# Patient Record
Sex: Female | Born: 1980
Health system: Southern US, Community
[De-identification: ages and names within clinical notes are randomized; demographics above are authoritative.]

## PROBLEM LIST (undated history)

## (undated) DIAGNOSIS — K589 Irritable bowel syndrome without diarrhea: Secondary | ICD-10-CM

## (undated) DIAGNOSIS — F419 Anxiety disorder, unspecified: Secondary | ICD-10-CM

## (undated) DIAGNOSIS — K208 Other esophagitis: Principal | ICD-10-CM

## (undated) DIAGNOSIS — Z8619 Personal history of other infectious and parasitic diseases: Secondary | ICD-10-CM

## (undated) DIAGNOSIS — K219 Gastro-esophageal reflux disease without esophagitis: Secondary | ICD-10-CM

## (undated) DIAGNOSIS — K649 Unspecified hemorrhoids: Secondary | ICD-10-CM

## (undated) DIAGNOSIS — J069 Acute upper respiratory infection, unspecified: Secondary | ICD-10-CM

## (undated) DIAGNOSIS — D649 Anemia, unspecified: Secondary | ICD-10-CM

## (undated) DIAGNOSIS — F329 Major depressive disorder, single episode, unspecified: Secondary | ICD-10-CM

## (undated) DIAGNOSIS — J189 Pneumonia, unspecified organism: Secondary | ICD-10-CM

## (undated) DIAGNOSIS — F32A Depression, unspecified: Secondary | ICD-10-CM

## (undated) HISTORY — DX: Other esophagitis: K20.8

## (undated) HISTORY — PX: OTHER SURGICAL HISTORY: SHX169

## (undated) HISTORY — PX: HERNIA REPAIR: SHX51

## (undated) HISTORY — PX: TONSILLECTOMY: SUR1361

## (undated) HISTORY — PX: WISDOM TOOTH EXTRACTION: SHX21

## (undated) HISTORY — DX: Personal history of other infectious and parasitic diseases: Z86.19

## (undated) HISTORY — DX: Unspecified hemorrhoids: K64.9

## (undated) HISTORY — PX: COLONOSCOPY: SHX174

## (undated) HISTORY — DX: Anxiety disorder, unspecified: F41.9

---
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
## 2006-09-27 ENCOUNTER — Other Ambulatory Visit: Admission: RE | Admit: 2006-09-27 | Discharge: 2006-09-27 | Payer: Self-pay | Admitting: *Deleted

## 2009-06-28 ENCOUNTER — Encounter: Admission: RE | Admit: 2009-06-28 | Discharge: 2009-06-28 | Payer: Self-pay | Admitting: Family Medicine

## 2009-07-07 ENCOUNTER — Encounter: Admission: RE | Admit: 2009-07-07 | Discharge: 2009-07-07 | Payer: Self-pay | Admitting: Obstetrics & Gynecology

## 2010-04-10 ENCOUNTER — Encounter: Payer: Self-pay | Admitting: Family Medicine

## 2010-12-19 ENCOUNTER — Encounter (HOSPITAL_COMMUNITY): Payer: Self-pay | Admitting: Anesthesiology

## 2010-12-19 ENCOUNTER — Ambulatory Visit (HOSPITAL_COMMUNITY)
Admission: RE | Admit: 2010-12-19 | Discharge: 2010-12-19 | Disposition: A | Discharge status: Home / Self Care | Payer: BC Managed Care – PPO | Source: Ambulatory Visit | Attending: Obstetrics & Gynecology | Admitting: Obstetrics & Gynecology

## 2010-12-19 ENCOUNTER — Inpatient Hospital Stay (HOSPITAL_COMMUNITY): Payer: BC Managed Care – PPO

## 2010-12-19 ENCOUNTER — Other Ambulatory Visit: Payer: Self-pay | Admitting: Obstetrics & Gynecology

## 2010-12-19 ENCOUNTER — Encounter (HOSPITAL_COMMUNITY): Payer: Self-pay | Admitting: *Deleted

## 2010-12-19 ENCOUNTER — Encounter (HOSPITAL_COMMUNITY)
Admission: RE | Disposition: A | Discharge status: Home / Self Care | Payer: Self-pay | Source: Ambulatory Visit | Attending: Obstetrics & Gynecology

## 2010-12-19 ENCOUNTER — Encounter (HOSPITAL_COMMUNITY): Payer: Self-pay

## 2010-12-19 ENCOUNTER — Ambulatory Visit (HOSPITAL_COMMUNITY): Payer: BC Managed Care – PPO | Admitting: Anesthesiology

## 2010-12-19 ENCOUNTER — Inpatient Hospital Stay (HOSPITAL_COMMUNITY)
Admission: AD | Admit: 2010-12-19 | Discharge: 2010-12-19 | Disposition: A | Discharge status: Home / Self Care | Payer: BC Managed Care – PPO | Source: Ambulatory Visit | Attending: Obstetrics and Gynecology | Admitting: Obstetrics and Gynecology

## 2010-12-19 DIAGNOSIS — O4690 Antepartum hemorrhage, unspecified, unspecified trimester: Secondary | ICD-10-CM

## 2010-12-19 DIAGNOSIS — O039 Complete or unspecified spontaneous abortion without complication: Secondary | ICD-10-CM

## 2010-12-19 DIAGNOSIS — O021 Missed abortion: Secondary | ICD-10-CM | POA: Insufficient documentation

## 2010-12-19 HISTORY — DX: Major depressive disorder, single episode, unspecified: F32.9

## 2010-12-19 HISTORY — DX: Gastro-esophageal reflux disease without esophagitis: K21.9

## 2010-12-19 HISTORY — PX: DILATION AND EVACUATION: SHX1459

## 2010-12-19 HISTORY — DX: Depression, unspecified: F32.A

## 2010-12-19 HISTORY — DX: Acute upper respiratory infection, unspecified: J06.9

## 2010-12-19 HISTORY — DX: Irritable bowel syndrome, unspecified: K58.9

## 2010-12-19 LAB — CBC
Hemoglobin: 13 g/dL (ref 12.0–15.0)
MCH: 28.8 pg (ref 26.0–34.0)
MCHC: 34.3 g/dL (ref 30.0–36.0)
Platelets: 237 10*3/uL (ref 150–400)
RBC: 4.52 MIL/uL (ref 3.87–5.11)

## 2010-12-19 LAB — ABO/RH: ABO/RH(D): A POS

## 2010-12-19 SURGERY — DILATION AND EVACUATION, UTERUS
Anesthesia: Choice

## 2010-12-19 MED ORDER — PROPOFOL 10 MG/ML IV EMUL
INTRAVENOUS | Status: AC
  Filled 2010-12-19: qty 20

## 2010-12-19 MED ORDER — METHYLERGONOVINE MALEATE 0.2 MG/ML IJ SOLN
INTRAMUSCULAR | Status: AC
  Filled 2010-12-19: qty 1

## 2010-12-19 MED ORDER — ONDANSETRON HCL 4 MG/2ML IJ SOLN
INTRAMUSCULAR | Status: DC | PRN
  Administered 2010-12-19: 4 mg via INTRAVENOUS

## 2010-12-19 MED ORDER — METOCLOPRAMIDE HCL 5 MG/ML IJ SOLN: 10.0000 mg | Freq: Once | INTRAMUSCULAR | Status: DC | PRN

## 2010-12-19 MED ORDER — KETOROLAC TROMETHAMINE 30 MG/ML IJ SOLN
INTRAMUSCULAR | Status: DC | PRN
  Administered 2010-12-19: 30 mg via INTRAVENOUS

## 2010-12-19 MED ORDER — PROPOFOL 10 MG/ML IV EMUL
INTRAVENOUS | Status: DC | PRN
  Administered 2010-12-19: 50 mg via INTRAVENOUS
  Administered 2010-12-19 (×2): 20 mg via INTRAVENOUS
  Administered 2010-12-19: 40 mg via INTRAVENOUS
  Administered 2010-12-19 (×2): 10 mg via INTRAVENOUS
  Administered 2010-12-19 (×2): 20 mg via INTRAVENOUS
  Administered 2010-12-19: 50 mg via INTRAVENOUS
  Administered 2010-12-19: 10 mg via INTRAVENOUS

## 2010-12-19 MED ORDER — FENTANYL CITRATE 0.05 MG/ML IJ SOLN
INTRAMUSCULAR | Status: AC
  Filled 2010-12-19: qty 5

## 2010-12-19 MED ORDER — LIDOCAINE HCL (CARDIAC) 20 MG/ML IV SOLN
INTRAVENOUS | Status: AC
  Filled 2010-12-19: qty 5

## 2010-12-19 MED ORDER — KETOROLAC TROMETHAMINE 60 MG/2ML IM SOLN
INTRAMUSCULAR | Status: DC | PRN
  Administered 2010-12-19: 30 mg via INTRAMUSCULAR

## 2010-12-19 MED ORDER — FENTANYL CITRATE 0.05 MG/ML IJ SOLN: 25.0000 ug | INTRAMUSCULAR | Status: DC | PRN

## 2010-12-19 MED ORDER — MIDAZOLAM HCL 5 MG/5ML IJ SOLN
INTRAMUSCULAR | Status: DC | PRN
  Administered 2010-12-19: 2 mg via INTRAVENOUS

## 2010-12-19 MED ORDER — LACTATED RINGERS IV SOLN
INTRAVENOUS | Status: DC
  Administered 2010-12-19 (×3): via INTRAVENOUS

## 2010-12-19 MED ORDER — DEXAMETHASONE SODIUM PHOSPHATE 10 MG/ML IJ SOLN
INTRAMUSCULAR | Status: DC | PRN
  Administered 2010-12-19: 5 mg via INTRAVENOUS

## 2010-12-19 MED ORDER — CEFAZOLIN SODIUM 1-5 GM-% IV SOLN
1.0000 g | INTRAVENOUS | Status: AC
  Administered 2010-12-19: 1 g via INTRAVENOUS

## 2010-12-19 MED ORDER — MIDAZOLAM HCL 2 MG/2ML IJ SOLN
INTRAMUSCULAR | Status: AC
Start: 2010-12-19 — End: 2010-12-19
  Filled 2010-12-19: qty 2

## 2010-12-19 MED ORDER — OXYCODONE-ACETAMINOPHEN 5-325 MG PO TABS: 1.0000 | ORAL_TABLET | Freq: Four times a day (QID) | ORAL | Status: AC | PRN

## 2010-12-19 MED ORDER — ONDANSETRON HCL 4 MG/2ML IJ SOLN
INTRAMUSCULAR | Status: AC
  Filled 2010-12-19: qty 2

## 2010-12-19 MED ORDER — METHYLERGONOVINE MALEATE 0.2 MG/ML IJ SOLN
INTRAMUSCULAR | Status: DC | PRN
  Administered 2010-12-19: 0.2 mg via INTRAMUSCULAR

## 2010-12-19 MED ORDER — CHLOROPROCAINE HCL 1 % IJ SOLN
INTRAMUSCULAR | Status: DC | PRN
  Administered 2010-12-19: 30 mL

## 2010-12-19 MED ORDER — LIDOCAINE HCL (CARDIAC) 20 MG/ML IV SOLN
INTRAVENOUS | Status: DC | PRN
  Administered 2010-12-19: 60 mg via INTRAVENOUS

## 2010-12-19 MED ORDER — FENTANYL CITRATE 0.05 MG/ML IJ SOLN
INTRAMUSCULAR | Status: DC | PRN
  Administered 2010-12-19 (×3): 50 ug via INTRAVENOUS

## 2010-12-19 MED ORDER — DOXYCYCLINE HYCLATE 100 MG PO TABS: 100.0000 mg | ORAL_TABLET | Freq: Two times a day (BID) | ORAL | Status: AC

## 2010-12-19 SURGICAL SUPPLY — 20 items
CATH ROBINSON RED A/P 16FR (CATHETERS) ×2 IMPLANT
CLOTH BEACON ORANGE TIMEOUT ST (SAFETY) ×2 IMPLANT
DECANTER SPIKE VIAL GLASS SM (MISCELLANEOUS) ×2 IMPLANT
DRAPE UTILITY XL STRL (DRAPES) ×2 IMPLANT
GLOVE BIO SURGEON STRL SZ7 (GLOVE) ×2 IMPLANT
GLOVE BIOGEL PI IND STRL 7.0 (GLOVE) ×2 IMPLANT
GLOVE BIOGEL PI INDICATOR 7.0 (GLOVE) ×2
GOWN PREVENTION PLUS LG XLONG (DISPOSABLE) ×2 IMPLANT
KIT BERKELEY 1ST TRIMESTER 3/8 (MISCELLANEOUS) ×2 IMPLANT
NEEDLE SPNL 22GX3.5 QUINCKE BK (NEEDLE) ×2 IMPLANT
NS IRRIG 1000ML POUR BTL (IV SOLUTION) ×2 IMPLANT
PACK VAGINAL MINOR WOMEN LF (CUSTOM PROCEDURE TRAY) ×2 IMPLANT
PAD PREP 24X48 CUFFED NSTRL (MISCELLANEOUS) ×2 IMPLANT
SET BERKELEY SUCTION TUBING (SUCTIONS) ×2 IMPLANT
SYR CONTROL 10ML LL (SYRINGE) ×2 IMPLANT
TOWEL OR 17X24 6PK STRL BLUE (TOWEL DISPOSABLE) ×4 IMPLANT
VACURETTE 10 RIGID CVD (CANNULA) IMPLANT
VACURETTE 7MM CVD STRL WRAP (CANNULA) IMPLANT
VACURETTE 8 RIGID CVD (CANNULA) IMPLANT
VACURETTE 9 RIGID CVD (CANNULA) IMPLANT

## 2010-12-19 NOTE — H&P (Signed)
Stephanie Harrington is an 30 y.o. female G1 at 6.5/7 wks by [facility] with missed abortion, no fetal cardiac activity, some bleeding, seen in ED on 12/18/10 night that confirmed MAB.  Quit smoking 1 yr back.  Patient's last menstrual period was 10/27/2010. Menses regular before pregnancy.    Past Medical History  Diagnosis Date  . IBS (irritable bowel syndrome)   . Depression   . Recurrent upper respiratory infection (URI)   . GERD (gastroesophageal reflux disease)     no meds    Past Surgical History  Procedure Date  . Tonsillectomy   . Addenoidectomy   . Wisdom tooth extraction     Family History  Problem Relation Age of Onset  . Heart disease Mother   . Hyperlipidemia Father   . Hypertension Father   . Cancer Maternal Grandmother   . Heart disease Maternal Grandmother   . Coronary artery disease Maternal Grandmother     Social History:  reports that she quit smoking about a year ago. Her smoking use included Cigarettes. She has a 2.25 pack-year smoking history. She does not have any smokeless tobacco history on file. She reports that she does not drink alcohol or use illicit drugs.  Allergies: No Known Allergies  Prescriptions prior to admission  Medication Sig Dispense Refill  . acetaminophen (TYLENOL) 500 MG tablet Take 500 mg by mouth every 6 (six) hours as needed. For pain       . dimenhyDRINATE (DRAMAMINE) 50 MG tablet Take 50 mg by mouth every 8 (eight) hours as needed.        . prenatal vitamin w/FE, FA (PRENATAL 1 + 1) 27-1 MG TABS Take 1 tablet by mouth daily.          @ROS @ Denies SOB/chest pain/ HA/ vision changes/ LE pain or swelling/ etc.  Objective:   Blood pressure 119/74, pulse 61, temperature 98.8 F (37.1 C), temperature source Oral, resp. rate 18, weight 131 lb (59.421 kg), last menstrual period 10/27/2010, SpO2 100.00%. Physical exam:  A&O x 3, no acute distress. Pleasant HEENT neg, no thyromegaly Lungs CTA bilat CV RRR, S1S2 normal Abdo soft, non  tender, non acute Extr no edema/ tenderness Pelvic deferred.    Results for orders placed during the hospital encounter of 12/19/10 (from the past 24 hour(s))  CBC     Status: Normal   Collection Time   12/19/10  3:52 PM      Component Value Range   WBC 9.5  4.0 - 10.5 (K/uL)   RBC 4.52  3.87 - 5.11 (MIL/uL)   Hemoglobin 13.0  12.0 - 15.0 (g/dL)   HCT 86.5  78.4 - 69.6 (%)   MCV 83.8  78.0 - 100.0 (fL)   MCH 28.8  26.0 - 34.0 (pg)   MCHC 34.3  30.0 - 36.0 (g/dL)   RDW 29.5  28.4 - 13.2 (%)   Platelets 237  150 - 400 (K/uL)   Assessment/Plan: G1 at 6.5/7 wks, Missed abortion. Management options reviewed, NPO since 9 pm last night. Desires D&E in OR under anesthesia. Procedure reviewed, consent obtained.   Shawne Bulow R 12/19/2010, 5:08 PM

## 2010-12-19 NOTE — Op Note (Signed)
Preoperative diagnosis: 6.5/[redacted] wk gestation with missed abortion Postop diagnosis: as above.  Procedure: Suction D&E Anesthesia: MAC and paracervical block.. Surgeon: Shea Evans, MD Assistant: none  IV fluids : LR 800 cc in OR (1lit prior to in holding area) Estimated blood loss : 30 cc Urine output:  No cath. Patient voided pre-op Complications none Condition stable Disposition PACU and then home Specimen: products of conception sent to pathology.  Procedure  Indication: G1P0 at 6.5/7 wks with bleeding and pelvic sono consistent with missed abortion. Options were reviewed in office with risks/ benefits. Patient chose to proceed with suction D&E. Risks/ complications of surgery including infection, bleeding, damage to internal organs including uterine perforation, Asherman syndrome were reviewed. She voiced understanding and gave informed written consent.  Patient was brought to the operating room with IV running. Time out was carried out. She received preop 1 gm Ancef. She underwent general endotracheal anesthesia without complications. She was given dorsolithotomy position. Parts were prepped and draped in standard fashion.  Bimanual exam revealed uterus to be retroverted and 8-10 week size.  Speculum was placed and cervix was grasped with single-tooth tenaculum. The uterus was sounded to 9 cm. 30 cc of 1% plain nasocaine used for anterior cervical and paracervical block. Cervical os was dilated to 23 [place] dilator. A 7 curved plastic suction curette was introduced in the uterine cavity and suction evacuation of products of conception was done in multiple stepwise fashion. Gentle sharp curettage was performed. Suction cannula was reintroduced and uterus was emptied.  Prophylactic Methergine 0.2 mg IM given since uterine cavity sounded larger than 6-7 wks.The bleeding was controlled well and uterus was firm on exam.  At this point the procedure was complete. Pinpoint bleeding noted from  tenaculum site and Monsel solution applied. Toradol received in OR.  All counts are correct x2. Specimen products of conception. No complications. Patient was made supine, recovered from anesthesia and brought to the recovery room in stable condition.  Patient will be discharged home. Follow up in 2 weeks in office. Post op care, warning signs of infection and excessive bleeding reviewed.   V.Keldric Poyer, MD.

## 2010-12-19 NOTE — Progress Notes (Signed)
Dr. Cherly Hensen notified of [place] results.  Will be up to see pt.

## 2010-12-19 NOTE — Progress Notes (Signed)
Dr. Cherly Hensen called.  Order given for ABO/RH.  See orders.

## 2010-12-19 NOTE — Anesthesia Preprocedure Evaluation (Addendum)
Anesthesia Evaluation  Name, MR# and DOB Patient awake  General Assessment Comment  Reviewed: Allergy & Precautions, H&P , NPO status , Patient's Chart, lab work & pertinent test results  Airway Mallampati: II TM Distance: >3 FB Neck ROM: full    Dental No notable dental hx. (+) Teeth Intact   Pulmonary    Pulmonary exam normal       Cardiovascular     Neuro/Psych Negative Psych ROS   GI/Hepatic negative GI ROS Neg liver ROS    Endo/Other    Renal/GU      Musculoskeletal   Abdominal   Peds  Hematology negative hematology ROS (+)   Anesthesia Other Findings   Reproductive/Obstetrics negative OB ROS                           Anesthesia Physical Anesthesia Plan  ASA: II  Anesthesia Plan: MAC   Post-op Pain Management:    Induction:   Airway Management Planned:   Additional Equipment:   Intra-op Plan:   Post-operative Plan:   Informed Consent: I have reviewed the patients History and Physical, chart, labs and discussed the procedure including the risks, benefits and alternatives for the proposed anesthesia with the patient or authorized representative who has indicated his/her understanding and acceptance.   Dental Advisory Given  Plan Discussed with: Anesthesiologist  Anesthesia Plan Comments:        Anesthesia Quick Evaluation

## 2010-12-19 NOTE — Transfer of Care (Signed)
Immediate Anesthesia Transfer of Care Note  Patient: Stephanie Harrington  Procedure(s) Performed:  DILATATION AND EVACUATION (D&E)  Patient Location: PACU  Anesthesia Type: MAC  Level of Consciousness: awake, alert  and oriented  Airway & Oxygen Therapy: Patient Spontanous Breathing  Post-op Assessment: Report given to PACU RN  Post vital signs: Reviewed and stable  Complications: No apparent anesthesia complications

## 2010-12-19 NOTE — Progress Notes (Signed)
Pt states she had sex yesterday and tonight she discovered bleeding on tissue after wiping.  Scant amt red/brown blood on tissue her in MAU

## 2010-12-19 NOTE — Progress Notes (Signed)
SSE done per Dr. Cherly Hensen.  VE done.

## 2010-12-19 NOTE — Progress Notes (Signed)
Waiting for [place] results to call physician.

## 2010-12-19 NOTE — Progress Notes (Signed)
Dr. Cherly Hensen at bedside.  Assessment done and poc discussed with pt.

## 2010-12-19 NOTE — Anesthesia Postprocedure Evaluation (Signed)
  Anesthesia Post-op Note  Patient: Stephanie Harrington  Procedure(s) Performed:  DILATATION AND EVACUATION (D&E)  Patient Location: PACU  Anesthesia Type: MAC  Level of Consciousness: awake, alert  and oriented  Airway and Oxygen Therapy: Patient Spontanous Breathing  Post-op Pain: none  Post-op Assessment: Post-op Vital signs reviewed, Patient's Cardiovascular Status Stable, Respiratory Function Stable, Patent Airway, No signs of Nausea or vomiting and Pain level controlled  Post-op Vital Signs: Reviewed and stable  Complications: No apparent anesthesia complications

## 2010-12-19 NOTE — ED Provider Notes (Signed)
History     Chief Complaint  Patient presents with  . Vaginal Bleeding   HPI 30 yo G1P0 MWF now @ 8 wk by LMP presents for evaluation of vaginal bleeding starting at 1:30 am. (-)cramping. Last coitus 5pm sunday  OB History    Grav Para Term Preterm Abortions TAB SAB Ect Mult Living   1               Past Medical History  Diagnosis Date  . IBS (irritable bowel syndrome)     Past Surgical History  Procedure Date  . Tonsillectomy   . Addenoidectomy   . Wisdom tooth extraction     Family History  Problem Relation Age of Onset  . Heart disease Mother   . Hyperlipidemia Father   . Hypertension Father   . Cancer Maternal Grandmother   . Heart disease Maternal Grandmother     History  Substance Use Topics  . Smoking status: Former Smoker -- 0.2 packs/day for 9 years    Types: Cigarettes    Quit date: 12/18/2009  . Smokeless tobacco: Not on file  . Alcohol Use: No    Allergies: No Known Allergies  Prescriptions prior to admission  Medication Sig Dispense Refill  . acetaminophen (TYLENOL) 500 MG tablet Take 500 mg by mouth every 6 (six) hours as needed. For pain       . prenatal vitamin w/FE, FA (PRENATAL 1 + 1) 27-1 MG TABS Take 1 tablet by mouth daily.          Review of Systems  Genitourinary:       Vaginal bleeding  All other systems reviewed and are negative.   Physical Exam   Blood pressure 129/92, pulse 80, temperature 99 F (37.2 C), temperature source Oral, resp. rate 16, height 5\' 4"  (1.626 m), weight 133 lb (60.328 kg).  Physical Exam  Constitutional: She appears well-developed and well-nourished.  HENT:  Head: Normocephalic.  Cardiovascular: Regular rhythm.   Respiratory: Breath sounds normal.  GI: Soft. There is no tenderness.  Genitourinary: Vaginal discharge found.       Dark brown blood. Vulva (-)lesion. Cervix closed. Uterus sl enlarged 7 wk size nontender  No adnexal mass  Neurological: She is alert.  Psychiatric: She has a normal mood  and affect.    MAU Course  Procedures ultrasound: abnormal sac w/ debris 6wk 5 day no fetal pole  MDM   Assessment and Plan  Missed AB w/ 1st trim vaginal bleeding P) Rh status. Rhophylac if indicated. Findings of sono reviewed. Options disc( SAB, cytotec, D&E). 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 Dr. Juliene Pina prim OB so will call office to disc later today. SAB precautions  Zorana Brockwell A 12/19/2010, 4:55 AM

## 2010-12-20 ENCOUNTER — Encounter (HOSPITAL_COMMUNITY): Payer: Self-pay | Admitting: Obstetrics & Gynecology

## 2010-12-21 NOTE — Addendum Note (Signed)
Addendum  created 12/21/10 1953 by Gerard Bonus L. Ayiden Milliman   Modules edited:Charting, Inpatient Notes

## 2010-12-21 NOTE — Addendum Note (Signed)
Addendum  created 12/21/10 1953 by Perpetua Elling L. Amaree Loisel   Modules edited:Charting, Inpatient Notes    

## 2011-04-14 LAB — OB RESULTS CONSOLE HEPATITIS B SURFACE ANTIGEN: Hepatitis B Surface Ag: NEGATIVE

## 2011-04-14 LAB — OB RESULTS CONSOLE ABO/RH: RH Type: POSITIVE

## 2011-04-14 LAB — OB RESULTS CONSOLE HIV ANTIBODY (ROUTINE TESTING): HIV: NONREACTIVE

## 2011-04-14 LAB — OB RESULTS CONSOLE RUBELLA ANTIBODY, IGM: Rubella: NON-IMMUNE/NOT IMMUNE

## 2011-04-14 LAB — OB RESULTS CONSOLE VARICELLA ZOSTER ANTIBODY, IGG: Varicella: NON-IMMUNE/NOT IMMUNE

## 2011-11-16 ENCOUNTER — Other Ambulatory Visit: Payer: Self-pay | Admitting: Obstetrics & Gynecology

## 2011-11-17 ENCOUNTER — Inpatient Hospital Stay (HOSPITAL_COMMUNITY): Payer: BC Managed Care – PPO

## 2011-11-17 ENCOUNTER — Encounter (HOSPITAL_COMMUNITY): Payer: Self-pay

## 2011-11-17 ENCOUNTER — Inpatient Hospital Stay (HOSPITAL_COMMUNITY)
Admission: RE | Admit: 2011-11-17 | Discharge: 2011-11-21 | DRG: 370 | Disposition: A | Discharge status: Home / Self Care | Payer: BC Managed Care – PPO | Source: Ambulatory Visit | Attending: Obstetrics & Gynecology | Admitting: Obstetrics & Gynecology

## 2011-11-17 ENCOUNTER — Encounter (HOSPITAL_COMMUNITY): Payer: Self-pay | Admitting: *Deleted

## 2011-11-17 ENCOUNTER — Telehealth (HOSPITAL_COMMUNITY): Payer: Self-pay | Admitting: *Deleted

## 2011-11-17 DIAGNOSIS — O34219 Maternal care for unspecified type scar from previous cesarean delivery: Secondary | ICD-10-CM | POA: Diagnosis present

## 2011-11-17 DIAGNOSIS — D62 Acute posthemorrhagic anemia: Secondary | ICD-10-CM | POA: Diagnosis not present

## 2011-11-17 DIAGNOSIS — O324XX0 Maternal care for high head at term, not applicable or unspecified: Secondary | ICD-10-CM | POA: Diagnosis present

## 2011-11-17 DIAGNOSIS — O411090 Infection of amniotic sac and membranes, unspecified, unspecified trimester, not applicable or unspecified: Secondary | ICD-10-CM | POA: Diagnosis present

## 2011-11-17 DIAGNOSIS — O9903 Anemia complicating the puerperium: Secondary | ICD-10-CM | POA: Diagnosis not present

## 2011-11-17 LAB — CBC
MCH: 30.5 pg (ref 26.0–34.0)
MCHC: 35.4 g/dL (ref 30.0–36.0)
Platelets: 235 10*3/uL (ref 150–400)

## 2011-11-17 MED ORDER — OXYTOCIN BOLUS FROM INFUSION
250.0000 mL | Freq: Once | INTRAVENOUS | Status: DC
  Filled 2011-11-17: qty 500

## 2011-11-17 MED ORDER — BUTORPHANOL TARTRATE 1 MG/ML IJ SOLN
1.0000 mg | INTRAMUSCULAR | Status: DC | PRN
  Administered 2011-11-18 (×2): 1 mg via INTRAVENOUS
  Filled 2011-11-17 (×2): qty 1

## 2011-11-17 MED ORDER — FLEET ENEMA 7-19 GM/118ML RE ENEM: 1.0000 | ENEMA | RECTAL | Status: DC | PRN

## 2011-11-17 MED ORDER — LIDOCAINE HCL (PF) 1 % IJ SOLN: 30.0000 mL | INTRAMUSCULAR | Status: DC | PRN

## 2011-11-17 MED ORDER — ONDANSETRON HCL 4 MG/2ML IJ SOLN: 4.0000 mg | Freq: Four times a day (QID) | INTRAMUSCULAR | Status: DC | PRN

## 2011-11-17 MED ORDER — ACETAMINOPHEN 325 MG PO TABS: 650.0000 mg | ORAL_TABLET | ORAL | Status: DC | PRN

## 2011-11-17 MED ORDER — TERBUTALINE SULFATE 1 MG/ML IJ SOLN: 0.2500 mg | Freq: Once | INTRAMUSCULAR | Status: AC | PRN

## 2011-11-17 MED ORDER — IBUPROFEN 600 MG PO TABS: 600.0000 mg | ORAL_TABLET | Freq: Four times a day (QID) | ORAL | Status: DC | PRN

## 2011-11-17 MED ORDER — OXYCODONE-ACETAMINOPHEN 5-325 MG PO TABS: 1.0000 | ORAL_TABLET | ORAL | Status: DC | PRN

## 2011-11-17 MED ORDER — LACTATED RINGERS IV SOLN
500.0000 mL | INTRAVENOUS | Status: DC | PRN
  Administered 2011-11-18: 500 mL via INTRAVENOUS

## 2011-11-17 MED ORDER — LACTATED RINGERS IV SOLN
INTRAVENOUS | Status: DC
  Administered 2011-11-17 – 2011-11-18 (×2): via INTRAVENOUS

## 2011-11-17 MED ORDER — CITRIC ACID-SODIUM CITRATE 334-500 MG/5ML PO SOLN
30.0000 mL | ORAL | Status: DC | PRN
  Administered 2011-11-18: 30 mL via ORAL
  Filled 2011-11-17: qty 15

## 2011-11-17 MED ORDER — ZOLPIDEM TARTRATE 5 MG PO TABS
5.0000 mg | ORAL_TABLET | Freq: Every evening | ORAL | Status: DC | PRN
  Administered 2011-11-18: 5 mg via ORAL
  Filled 2011-11-17: qty 1

## 2011-11-17 MED ORDER — MISOPROSTOL 25 MCG QUARTER TABLET
25.0000 ug | ORAL_TABLET | ORAL | Status: DC | PRN
  Administered 2011-11-17: 25 ug via VAGINAL
  Filled 2011-11-17: qty 0.25

## 2011-11-17 MED ORDER — OXYTOCIN 40 UNITS IN LACTATED RINGERS INFUSION - SIMPLE MED: 62.5000 mL/h | Freq: Once | INTRAVENOUS | Status: DC

## 2011-11-17 NOTE — Progress Notes (Signed)
Dr. Juliene Pina notified of induction pt here; informed that RN was unable to determine fetal presentation by cervical exam. Order received for bedside U/S to determine presentation.

## 2011-11-17 NOTE — H&P (Signed)
Stephanie Harrington is a 31 y.o. female requesting labor IOL for dates. Denies contraxns/bleeding/leaking. Feels good FMs.  [facility] from 8 wks. No complications after 1st trim bleeding and placenta previa that resolved.  Last sono 8/29 7'12", Vtx.    History OB History    Grav Para Term Preterm Abortions TAB SAB Ect Mult Living   2 0   1  1        Past Medical History  Diagnosis Date  . IBS (irritable bowel syndrome)   . Depression   . Recurrent upper respiratory infection (URI)   . GERD (gastroesophageal reflux disease)     no meds  . Hemorrhoid   . H/O varicella    Past Surgical History  Procedure Date  . Tonsillectomy   . Addenoidectomy   . Wisdom tooth extraction   . Dilation and evacuation 12/19/2010    Procedure: DILATATION AND EVACUATION (D&E);  Surgeon: Robley Fries;  Location: [facility];  Service: Gynecology;  Laterality: N/A;   Family History: family history includes Cancer in her father and maternal grandmother; Coronary artery disease in her maternal grandmother; Heart attack in her mother; Heart disease in her maternal grandmother and mother; Hyperlipidemia in her father; and Hypertension in her father. Social History:  reports that she quit smoking about 22 months ago. Her smoking use included Cigarettes. She has a 2.25 pack-year smoking history. She has never used smokeless tobacco. She reports that she does not drink alcohol or use illicit drugs.   Prenatal Transfer Tool  Maternal Diabetes: No Genetic Screening: Normal Maternal Ultrasounds/Referrals: Normal Fetal Ultrasounds or other Referrals:  None Maternal Substance Abuse:  No Significant Maternal Medications:  None Significant Maternal Lab Results:  None Other Comments:  None  Review of Systems  Constitutional: Negative for fever.  Respiratory: Negative for cough.   Cardiovascular: Negative for chest pain.  Gastrointestinal: Negative for heartburn.  Musculoskeletal: Negative for myalgias.    Skin: Negative for rash.  Neurological: Negative for headaches.    Dilation: 1 Effacement (%): Thick Station: -3 Exam by:: C. Blackstock, RN Blood pressure 138/81, pulse 64, temperature 98.5 F (36.9 C), temperature source Oral, resp. rate 18, height 5\' 4"  (1.626 m), weight 156 lb (70.761 kg), last menstrual period 02/08/2011. Exam Physical Exam  A&O x 3, no acute distress. Pleasant HEENT neg, no thyromegaly Lungs CTA bilat CV RRR, S1S2 normal Abdo soft, non tender, non acute, relaxed gravid uterus Extr no edema/ tenderness Pelvic 1/50%/-3/ ballotable Vtx FHT  120s/ + accels/ no decels/mod variability- reassuring- category I Toco occasional irregular.   Prenatal labs: ABO, Rh: A/Positive/-- (01/25 0000) Antibody: Negative (01/25 0000) Rubella: Nonimmune (01/25 0000) RPR: Nonreactive (01/25 0000)  HBsAg: Negative (01/25 0000)  HIV: Non-reactive (01/25 0000)  GBS: Negative (07/30 0000)  Glucola nl Anatomy sono nl, and nl interval growth  Assessment/Plan: G2P0010 at 40.2/7 wks. Here for labor IOL. EFW 7.1/2 lbs  GBS neg. Unengaged vertex, confirmed by sono 8/28 and today (bedside) Cytotec IOL and then pitocin. NPO after midnight, assess fetal descent.     Wanda Rideout R 11/17/2011, 10:31 PM

## 2011-11-17 NOTE — Telephone Encounter (Signed)
Preadmission screen  

## 2011-11-18 ENCOUNTER — Encounter (HOSPITAL_COMMUNITY): Payer: Self-pay | Admitting: Anesthesiology

## 2011-11-18 ENCOUNTER — Inpatient Hospital Stay (HOSPITAL_COMMUNITY): Payer: BC Managed Care – PPO | Admitting: Anesthesiology

## 2011-11-18 ENCOUNTER — Encounter (HOSPITAL_COMMUNITY)
Admission: RE | Disposition: A | Discharge status: Home / Self Care | Payer: Self-pay | Source: Ambulatory Visit | Attending: Obstetrics & Gynecology

## 2011-11-18 LAB — RPR: RPR Ser Ql: NONREACTIVE

## 2011-11-18 SURGERY — Surgical Case
Anesthesia: Regional | Site: Abdomen | Wound class: Clean Contaminated

## 2011-11-18 MED ORDER — FENTANYL 2.5 MCG/ML BUPIVACAINE 1/10 % EPIDURAL INFUSION (WH - ANES)
14.0000 mL/h | INTRAMUSCULAR | Status: DC
  Administered 2011-11-18 (×3): 14 mL/h via EPIDURAL
  Filled 2011-11-18 (×4): qty 60

## 2011-11-18 MED ORDER — PHENYLEPHRINE HCL 10 MG/ML IJ SOLN
INTRAMUSCULAR | Status: DC | PRN
  Administered 2011-11-18 (×2): 80 ug via INTRAVENOUS
  Administered 2011-11-18 (×2): 120 ug via INTRAVENOUS

## 2011-11-18 MED ORDER — SCOPOLAMINE 1 MG/3DAYS TD PT72
MEDICATED_PATCH | TRANSDERMAL | Status: AC
  Filled 2011-11-18: qty 1

## 2011-11-18 MED ORDER — TERBUTALINE SULFATE 1 MG/ML IJ SOLN: 0.2500 mg | Freq: Once | INTRAMUSCULAR | Status: DC | PRN

## 2011-11-18 MED ORDER — OXYTOCIN 10 UNIT/ML IJ SOLN
40.0000 [IU] | INTRAVENOUS | Status: DC | PRN
  Administered 2011-11-18: 40 [IU] via INTRAVENOUS

## 2011-11-18 MED ORDER — SODIUM BICARBONATE 8.4 % IV SOLN
INTRAVENOUS | Status: AC
  Filled 2011-11-18: qty 50

## 2011-11-18 MED ORDER — FENTANYL CITRATE 0.05 MG/ML IJ SOLN
INTRAMUSCULAR | Status: AC
  Filled 2011-11-18: qty 2

## 2011-11-18 MED ORDER — CEFAZOLIN SODIUM-DEXTROSE 2-3 GM-% IV SOLR
2.0000 g | Freq: Three times a day (TID) | INTRAVENOUS | Status: AC
  Administered 2011-11-19 (×2): 2 g via INTRAVENOUS
  Filled 2011-11-18 (×2): qty 50

## 2011-11-18 MED ORDER — HYDROMORPHONE HCL PF 1 MG/ML IJ SOLN
0.2500 mg | INTRAMUSCULAR | Status: DC | PRN
  Administered 2011-11-18: 0.5 mg via INTRAVENOUS

## 2011-11-18 MED ORDER — DIPHENHYDRAMINE HCL 50 MG/ML IJ SOLN: 12.5000 mg | INTRAMUSCULAR | Status: DC | PRN

## 2011-11-18 MED ORDER — EPHEDRINE 5 MG/ML INJ: 10.0000 mg | INTRAVENOUS | Status: DC | PRN

## 2011-11-18 MED ORDER — LIDOCAINE-EPINEPHRINE (PF) 2 %-1:200000 IJ SOLN
INTRAMUSCULAR | Status: AC
  Filled 2011-11-18: qty 20

## 2011-11-18 MED ORDER — LACTATED RINGERS IV SOLN: 500.0000 mL | Freq: Once | INTRAVENOUS | Status: DC

## 2011-11-18 MED ORDER — MEPERIDINE HCL 25 MG/ML IJ SOLN
INTRAMUSCULAR | Status: DC | PRN
  Administered 2011-11-18 (×2): 12.5 mg via INTRAVENOUS

## 2011-11-18 MED ORDER — KETOROLAC TROMETHAMINE 60 MG/2ML IM SOLN
INTRAMUSCULAR | Status: AC
  Filled 2011-11-18: qty 2

## 2011-11-18 MED ORDER — KETOROLAC TROMETHAMINE 60 MG/2ML IM SOLN
60.0000 mg | Freq: Once | INTRAMUSCULAR | Status: AC | PRN
  Administered 2011-11-18: 60 mg via INTRAMUSCULAR

## 2011-11-18 MED ORDER — HYDROMORPHONE HCL PF 1 MG/ML IJ SOLN
INTRAMUSCULAR | Status: AC
  Filled 2011-11-18: qty 1

## 2011-11-18 MED ORDER — OXYTOCIN 10 UNIT/ML IJ SOLN
INTRAMUSCULAR | Status: AC
  Filled 2011-11-18: qty 4

## 2011-11-18 MED ORDER — EPHEDRINE 5 MG/ML INJ
10.0000 mg | INTRAVENOUS | Status: DC | PRN
  Filled 2011-11-18: qty 4

## 2011-11-18 MED ORDER — PHENYLEPHRINE 40 MCG/ML (10ML) SYRINGE FOR IV PUSH (FOR BLOOD PRESSURE SUPPORT)
PREFILLED_SYRINGE | INTRAVENOUS | Status: AC
  Filled 2011-11-18: qty 10

## 2011-11-18 MED ORDER — PHENYLEPHRINE 40 MCG/ML (10ML) SYRINGE FOR IV PUSH (FOR BLOOD PRESSURE SUPPORT): 80.0000 ug | PREFILLED_SYRINGE | INTRAVENOUS | Status: DC | PRN

## 2011-11-18 MED ORDER — ONDANSETRON HCL 4 MG/2ML IJ SOLN
INTRAMUSCULAR | Status: DC | PRN
  Administered 2011-11-18: 4 mg via INTRAVENOUS

## 2011-11-18 MED ORDER — FENTANYL CITRATE 0.05 MG/ML IJ SOLN
INTRAMUSCULAR | Status: DC | PRN
  Administered 2011-11-18 (×2): 100 ug via INTRAVENOUS

## 2011-11-18 MED ORDER — MORPHINE SULFATE 0.5 MG/ML IJ SOLN
INTRAMUSCULAR | Status: AC
  Filled 2011-11-18: qty 10

## 2011-11-18 MED ORDER — MORPHINE SULFATE (PF) 0.5 MG/ML IJ SOLN
INTRAMUSCULAR | Status: DC | PRN
  Administered 2011-11-18: 1 mg via INTRAVENOUS

## 2011-11-18 MED ORDER — LACTATED RINGERS IV SOLN
INTRAVENOUS | Status: DC | PRN
  Administered 2011-11-18 (×3): via INTRAVENOUS

## 2011-11-18 MED ORDER — MEPERIDINE HCL 25 MG/ML IJ SOLN: 6.2500 mg | INTRAMUSCULAR | Status: DC | PRN

## 2011-11-18 MED ORDER — CEFAZOLIN SODIUM-DEXTROSE 2-3 GM-% IV SOLR: 2.0000 g | INTRAVENOUS | Status: DC

## 2011-11-18 MED ORDER — CEFAZOLIN SODIUM 1-5 GM-% IV SOLN
INTRAVENOUS | Status: DC | PRN
  Administered 2011-11-18: 2 g via INTRAVENOUS

## 2011-11-18 MED ORDER — MEPERIDINE HCL 25 MG/ML IJ SOLN
INTRAMUSCULAR | Status: AC
  Filled 2011-11-18: qty 1

## 2011-11-18 MED ORDER — SODIUM BICARBONATE 8.4 % IV SOLN
INTRAVENOUS | Status: DC | PRN
  Administered 2011-11-18: 4 mL via EPIDURAL

## 2011-11-18 MED ORDER — MORPHINE SULFATE (PF) 0.5 MG/ML IJ SOLN
INTRAMUSCULAR | Status: DC | PRN
  Administered 2011-11-18: 4 mg via EPIDURAL

## 2011-11-18 MED ORDER — CEFAZOLIN SODIUM-DEXTROSE 2-3 GM-% IV SOLR
INTRAVENOUS | Status: AC
  Filled 2011-11-18: qty 50

## 2011-11-18 MED ORDER — SCOPOLAMINE 1 MG/3DAYS TD PT72
1.0000 | MEDICATED_PATCH | Freq: Once | TRANSDERMAL | Status: DC
  Administered 2011-11-18: 1.5 mg via TRANSDERMAL

## 2011-11-18 MED ORDER — FENTANYL 2.5 MCG/ML BUPIVACAINE 1/10 % EPIDURAL INFUSION (WH - ANES)
INTRAMUSCULAR | Status: DC | PRN
  Administered 2011-11-18: 14 mL/h via EPIDURAL

## 2011-11-18 MED ORDER — ONDANSETRON HCL 4 MG/2ML IJ SOLN
INTRAMUSCULAR | Status: AC
  Filled 2011-11-18: qty 2

## 2011-11-18 MED ORDER — PHENYLEPHRINE 40 MCG/ML (10ML) SYRINGE FOR IV PUSH (FOR BLOOD PRESSURE SUPPORT)
80.0000 ug | PREFILLED_SYRINGE | INTRAVENOUS | Status: DC | PRN
  Filled 2011-11-18: qty 5

## 2011-11-18 MED ORDER — OXYTOCIN 40 UNITS IN LACTATED RINGERS INFUSION - SIMPLE MED
1.0000 m[IU]/min | INTRAVENOUS | Status: DC
  Administered 2011-11-18: 1 m[IU]/min via INTRAVENOUS
  Filled 2011-11-18: qty 1000

## 2011-11-18 SURGICAL SUPPLY — 39 items
APL SKNCLS STERI-STRIP NONHPOA (GAUZE/BANDAGES/DRESSINGS) ×1
BENZOIN TINCTURE PRP APPL 2/3 (GAUZE/BANDAGES/DRESSINGS) ×2 IMPLANT
CHLORAPREP W/TINT 26ML (MISCELLANEOUS) ×2 IMPLANT
CLOTH BEACON ORANGE TIMEOUT ST (SAFETY) ×2 IMPLANT
CONTAINER PREFILL 10% NBF 15ML (MISCELLANEOUS) IMPLANT
DRESSING TELFA 8X3 (GAUZE/BANDAGES/DRESSINGS) ×2 IMPLANT
ELECT REM PT RETURN 9FT ADLT (ELECTROSURGICAL) ×2
ELECTRODE REM PT RTRN 9FT ADLT (ELECTROSURGICAL) ×1 IMPLANT
EXTRACTOR VACUUM KIWI (MISCELLANEOUS) IMPLANT
EXTRACTOR VACUUM M CUP 4 TUBE (SUCTIONS) IMPLANT
GAUZE SPONGE 4X4 12PLY STRL LF (GAUZE/BANDAGES/DRESSINGS) ×4 IMPLANT
GLOVE BIO SURGEON STRL SZ7 (GLOVE) ×2 IMPLANT
GLOVE BIOGEL PI IND STRL 7.0 (GLOVE) ×2 IMPLANT
GLOVE BIOGEL PI INDICATOR 7.0 (GLOVE) ×2
GOWN PREVENTION PLUS LG XLONG (DISPOSABLE) ×6 IMPLANT
KIT ABG SYR 3ML LUER SLIP (SYRINGE) IMPLANT
NEEDLE HYPO 25X5/8 SAFETYGLIDE (NEEDLE) IMPLANT
NS IRRIG 1000ML POUR BTL (IV SOLUTION) ×2 IMPLANT
PACK C SECTION WH (CUSTOM PROCEDURE TRAY) ×2 IMPLANT
PAD ABD 7.5X8 STRL (GAUZE/BANDAGES/DRESSINGS) ×2 IMPLANT
PAD OB MATERNITY 4.3X12.25 (PERSONAL CARE ITEMS) IMPLANT
RTRCTR C-SECT PINK 25CM LRG (MISCELLANEOUS) IMPLANT
SLEEVE SCD COMPRESS KNEE MED (MISCELLANEOUS) ×1 IMPLANT
STAPLER VISISTAT 35W (STAPLE) IMPLANT
STRIP CLOSURE SKIN 1/4X4 (GAUZE/BANDAGES/DRESSINGS) ×1 IMPLANT
SUT MNCRL 0 VIOLET CTX 36 (SUTURE) ×3 IMPLANT
SUT MONOCRYL 0 CTX 36 (SUTURE) ×3
SUT PLAIN 0 NONE (SUTURE) IMPLANT
SUT PLAIN 2 0 (SUTURE)
SUT PLAIN ABS 2-0 CT1 27XMFL (SUTURE) IMPLANT
SUT VIC AB 0 CT1 27 (SUTURE) ×4
SUT VIC AB 0 CT1 27XBRD ANBCTR (SUTURE) ×2 IMPLANT
SUT VIC AB 2-0 CT1 27 (SUTURE) ×4
SUT VIC AB 2-0 CT1 TAPERPNT 27 (SUTURE) ×2 IMPLANT
SUT VIC AB 4-0 KS 27 (SUTURE) ×2 IMPLANT
SUT VICRYL 0 TIES 12 18 (SUTURE) IMPLANT
TOWEL OR 17X24 6PK STRL BLUE (TOWEL DISPOSABLE) ×4 IMPLANT
TRAY FOLEY CATH 14FR (SET/KITS/TRAYS/PACK) IMPLANT
WATER STERILE IRR 1000ML POUR (IV SOLUTION) ×2 IMPLANT

## 2011-11-18 NOTE — Progress Notes (Signed)
Stephanie Harrington is a 31 y.o. G2P0010 at [redacted]w[redacted]d. IOL for dates and unengaged head for labor trial.  S/p Cytotec last night and contracting regularly with it, so pitocin is held. S/p epidural since Stadol x 2 didn't help with pain. Since epidural some left side pain, but will reassess.   Objective: BP 126/76  Pulse 64  Temp 98.3 F (36.8 C) (Oral)  Resp 18  Ht 5\' 4"  (1.626 m)  Wt 156 lb (70.761 kg)  BMI 26.78 kg/m2  SpO2 99%  LMP 02/08/2011   FHT:  FHR: 125 bpm, variability: moderate,  accelerations:  Present,  decelerations:  Absent UC:   regular, every 3-4 minutes SVE: 2-3/50%/-3/ Vtx/ controlled AROM done with clear fluid. No bleeding. Head still at -3/-2 station.   Assessment / Plan: 40.3/7 wks. G2P0, IOL with guarded prognosis for fetal descent. Vtx per exam. Continue to monitor for labor progress, will start pitocin if contractions space out.  GBS negative. Pain mngmt with Epidural.   Stephanie Harrington R 11/18/2011, 6:50 PM

## 2011-11-18 NOTE — Progress Notes (Signed)
Patient ID: YENIFER SACCENTE, female   DOB: [birth year], 31 y.o.   MRN: [id number] Subjective: Pain getting worse again.  Objective: BP 135/82  Pulse 78  Temp 100.1 F (37.8 C) (Oral)  Resp 20  Ht 5\' 4"  (1.626 m)  Wt 156 lb (70.761 kg)  BMI 26.78 kg/m2  SpO2 99%  LMP 02/08/2011  FHT:  FHR: 130 bpm, variability: moderate,  accelerations:  Present,  decelerations:  Absent UC:   regular, every 3 minutes, adequate SVE:   Dilation: 5 Effacement (%): 70 Station: -2 Exam by:: dr Audry Pecina No descent or dilatation. Hematuria noted. Caput noted.   Assessment / Plan: Failed descent and dilatation. Proceed with cesarean delivery.   Risks/complications of surgery reviewed incl infection, bleeding, damage to internal organs including bladder, bowels, ureters, blood vessels, other risks from anesthesia, VTE and delayed complications of any surgery, complications in future surgery reviewed. Also discussed neonatal complications incl difficult delivery, laceration, vacuum assistance, TTN etc. Pt understands and agrees, all concerns addressed.     Herndon Grill R 11/18/2011, 7:31 PM

## 2011-11-18 NOTE — Op Note (Addendum)
11/18/2011 Primary Low Transverse Cesarean Section Procedure Note   Stephanie Harrington  Indications: Dystocia   Pre-operative Diagnosis: 40.3/7 wks, labor induction,  failure to descend, unengaged vertex    Post-operative Diagnosis: Same , suspected chorioamnionitis  Surgeon:  Robley Fries, MD   Assistants: Marlinda Mike, CNM  Anesthesia: Epidural   Procedure Details:  The patient was seen in Labor Room and evaluated. IOL for 24 hrs, adequate contractions per IUPC with non descent of fetal vertex with only caput entering maternal inlet. Hematuria noted. Maternal temp rise to 100.1. Decision made to proceed with cesarean delivery. The risks, benefits, complications, treatment options, and expected outcomes were discussed with the patient. The patient concurred with the proposed plan, giving informed consent. identified as Stephanie Harrington and the procedure verified as C-Section Delivery. A Time Out was held in the Operating Room and the above information confirmed.  2 gm Ancef given pre-operative. Epidural anesthesia was noted to be adequate. Patient was prepped and draped in the usual sterile manner. A Pfannenstiel Incision was made and carried down through the subcutaneous tissue to the fascia. Fascial incision was made and extended transversely. The fascia was separated from the underlying rectus tissue superiorly and inferiorly. The peritoneum was identified and entered. Peritoneal incision was extended longitudinally. The utero-vesical peritoneal reflection was incised transversely and then a low transverse uterine incision was made. Baby was in OT position at the incision with minimal descent of fetal caput. Delivered from cephalic presentation was a healthy FEMALE infant at 20:13 hrs with Apgar scores of 9 and 9 at 1 and 5 minutes. Cord clamped, cut, infant nose and mouth suction with bulb and baby handed to NICU team. Cord blood was obtained for evaluation. The placenta was removed Intact and  appeared normal with 3 vessel cord. There was a slight foul odor noted in placenta (maternal temp pre-op 100.1, suspect chorioamnionitis).  The uterine outline, tubes and ovaries appeared normal. The uterine incision was closed in 2 layers, first with running locked sutures of 0Vicryl followed by another imbricating layer. Hemostasis was observed.  Peritoneum closed with 2-0 Vicryl. Muscles approximated in midline. The fascia was then reapproximated with running sutures of 0Vicryl. The subcuticular closure was performed using 3-0Vicryl. Steristrips applied and sterile dressing placed.  Instrument, sponge, and needle counts were correct prior the abdominal closure and were correct at the conclusion of the case.    Findings: FEMALE infant born at 20:13 hrs with Apgar scores of 9 and 9 at 1 and 5 minutes. Normal tubes and ovaries. Normal placenta with 3 vessel cord, suspect chorioamnionitis.   Estimated Blood Loss: 1000 cc   Total IV Fluids: 1900 cc LR  Urine Output: 200 cc clear in foley (hematuria resolved)  Specimens: Cord blood, placenta   Complications: None  Disposition: PACU  Maternal Condition: stable   Baby condition / location:  with mother, stable  Attending Attestation: I performed the procedure.   Signed: Surgeon(s): Robley Fries, MD

## 2011-11-18 NOTE — Anesthesia Procedure Notes (Signed)
Epidural Patient location during procedure: OB  Preanesthetic Checklist Completed: patient identified, site marked, surgical consent, pre-op evaluation, timeout performed, IV checked, risks and benefits discussed and monitors and equipment checked  Epidural Patient position: sitting Prep: site prepped and draped and DuraPrep Patient monitoring: continuous pulse ox and blood pressure Approach: midline Injection technique: LOR air  Needle:  Needle type: Tuohy  Needle gauge: 17 G Needle length: 9 cm and 9 Needle insertion depth: 5 cm cm Catheter type: closed end flexible Catheter size: 19 Gauge Catheter at skin depth: 10 cm Test dose: negative  Assessment Events: blood not aspirated, injection not painful, no injection resistance, negative IV test and no paresthesia  Additional Notes Dosing of Epidural:  1st dose, through needle ............................................. epi 1:200K + Xylocaine 40 mg  2nd dose, through catheter, after waiting 3 minutes.....epi 1:200K + Xylocaine 40 mg  3rd dose, through catheter after waiting 3 minutes .............................Marcaine   4mg   ( mg Marcaine are expressed as equivilent  cc's medication removed from the 0.1%Bupiv / fentanyl syringe from L&D pump)  ( 2% Xylo charted as a single dose in Epic Meds for ease of charting; actual dosing was fractionated as above, for saftey's sake)  As each dose occurred, patient was free of IV sx; and patient exhibited no evidence of SA injection.  Patient is more comfortable after epidural dosed. Please see RN's note for documentation of vital signs,and FHR which are stable.  Patient reminded not to try to ambulate with numb legs, and that an RN must be present the 1st time she attempts to get up.    

## 2011-11-18 NOTE — Transfer of Care (Signed)
Immediate Anesthesia Transfer of Care Note  Patient: Stephanie Harrington  Procedure(s) Performed: Procedure(s) (LRB): CESAREAN SECTION (N/A)  Patient Location: PACU  Anesthesia Type: Epidural  Level of Consciousness: awake, alert  and oriented  Airway & Oxygen Therapy: Patient Spontanous Breathing  Post-op Assessment: Report given to PACU RN and Post -op Vital signs reviewed and stable  Post vital signs: stable  Complications: No apparent anesthesia complications

## 2011-11-18 NOTE — Anesthesia Preprocedure Evaluation (Addendum)
Anesthesia Evaluation    Airway Mallampati: III TM Distance: >3 FB Neck ROM: Full    Dental No notable dental hx. (+) Teeth Intact   Pulmonary Recent URI , former smoker,  breath sounds clear to auscultation  Pulmonary exam normal       Cardiovascular negative cardio ROS  Rhythm:Regular Rate:Normal     Neuro/Psych Depression negative neurological ROS     GI/Hepatic Neg liver ROS, GERD-  Medicated and Controlled,IBS   Endo/Other  negative endocrine ROS  Renal/GU negative Renal ROS  negative genitourinary   Musculoskeletal negative musculoskeletal ROS (+)   Abdominal Normal abdominal exam  (+)   Peds  Hematology negative hematology ROS (+)   Anesthesia Other Findings   Reproductive/Obstetrics (+) Pregnancy                           Anesthesia Physical Anesthesia Plan  ASA: II  Anesthesia Plan: Epidural   Post-op Pain Management:    Induction:   Airway Management Planned: Natural Airway  Additional Equipment:   Intra-op Plan:   Post-operative Plan:   Informed Consent: I have reviewed the patients History and Physical, chart, labs and discussed the procedure including the risks, benefits and alternatives for the proposed anesthesia with the patient or authorized representative who has indicated his/her understanding and acceptance.   Dental advisory given  Plan Discussed with: Anesthesiologist and Surgeon  Anesthesia Plan Comments:         Anesthesia Quick Evaluation

## 2011-11-18 NOTE — Progress Notes (Signed)
Stephanie Harrington is a 31 y.o. G2P0010 at [redacted]w[redacted]d. IOL with cytotec, now pitocin. Pain well controlled after epidural was bolused.  Objective: BP 126/76  Pulse 64  Temp 98.3 F (36.8 C) (Oral)  Resp 18  Ht 5\' 4"  (1.626 m)  Wt 156 lb (70.761 kg)  BMI 26.78 kg/m2  SpO2 99%  LMP 02/08/2011   FHT:  FHR: 130s bpm, variability: moderate,  accelerations:  Present,  decelerations:  Absent UC:   regular, every 2-5 minutes with coupling SVE:   Dilation: 5 Effacement (%): 70 Station: -2 Exam by:: dr Angel Weedon Station still high but cervical change noted. Caput noted at -2 with head still ballotable.   Assessment / Plan: Poor fetal descent, still remains ballotable at -2-3 with caput noted. FHT category I. IUPC placed, assess labor contractions and reassess. Patient advised of possible c-section delivery, understands and agrees.    Quantrell Splitt R 11/18/2011, 6:56 PM

## 2011-11-18 NOTE — Anesthesia Postprocedure Evaluation (Signed)
  Anesthesia Post-op Note  Patient: Stephanie Harrington  Procedure(s) Performed: Procedure(s) (LRB): CESAREAN SECTION (N/A)   Patient is awake, responsive, moving her legs, and has signs of resolution of her numbness. Pain and nausea are reasonably well controlled. Vital signs are stable and clinically acceptable. Oxygen saturation is clinically acceptable. There are no apparent anesthetic complications at this time. Patient is ready for discharge.

## 2011-11-18 NOTE — Progress Notes (Signed)
Dr Jean Rosenthal @ bedside to redose pt - c/o of feeling uc's rates pain 4-5

## 2011-11-18 NOTE — Progress Notes (Signed)
C/s called for failure to decent. Risk and benefit explained to pt.  Consent signed and witnessed

## 2011-11-18 NOTE — Addendum Note (Signed)
Addendum  created 11/18/11 2144 by Jiles Garter, MD   Modules edited:Orders, PRL Based Order Sets

## 2011-11-18 NOTE — OR Nursing (Signed)
100 cc blood loss from fundal massage by DLWegner RN 

## 2011-11-19 ENCOUNTER — Encounter (HOSPITAL_COMMUNITY): Payer: Self-pay

## 2011-11-19 LAB — CBC
HCT: 28.5 % — ABNORMAL LOW (ref 36.0–46.0)
MCV: 87.7 fL (ref 78.0–100.0)
RDW: 12.9 % (ref 11.5–15.5)
WBC: 13.8 10*3/uL — ABNORMAL HIGH (ref 4.0–10.5)

## 2011-11-19 MED ORDER — NALBUPHINE HCL 10 MG/ML IJ SOLN
5.0000 mg | INTRAMUSCULAR | Status: DC | PRN
  Filled 2011-11-19: qty 1

## 2011-11-19 MED ORDER — KETOROLAC TROMETHAMINE 30 MG/ML IJ SOLN: 30.0000 mg | Freq: Four times a day (QID) | INTRAMUSCULAR | Status: AC | PRN

## 2011-11-19 MED ORDER — LANOLIN HYDROUS EX OINT: 1.0000 "application " | TOPICAL_OINTMENT | CUTANEOUS | Status: DC | PRN

## 2011-11-19 MED ORDER — ONDANSETRON HCL 4 MG/2ML IJ SOLN: 4.0000 mg | INTRAMUSCULAR | Status: DC | PRN

## 2011-11-19 MED ORDER — SODIUM CHLORIDE 0.9 % IJ SOLN: 3.0000 mL | INTRAMUSCULAR | Status: DC | PRN

## 2011-11-19 MED ORDER — SENNOSIDES-DOCUSATE SODIUM 8.6-50 MG PO TABS
2.0000 | ORAL_TABLET | Freq: Every day | ORAL | Status: DC
  Administered 2011-11-19 – 2011-11-20 (×2): 2 via ORAL

## 2011-11-19 MED ORDER — SUCCINYLCHOLINE CHLORIDE 20 MG/ML IJ SOLN
INTRAMUSCULAR | Status: AC
  Filled 2011-11-19: qty 10

## 2011-11-19 MED ORDER — MENTHOL 3 MG MT LOZG: 1.0000 | LOZENGE | OROMUCOSAL | Status: DC | PRN

## 2011-11-19 MED ORDER — IBUPROFEN 600 MG PO TABS
600.0000 mg | ORAL_TABLET | Freq: Four times a day (QID) | ORAL | Status: DC
  Administered 2011-11-19 – 2011-11-21 (×8): 600 mg via ORAL
  Filled 2011-11-19 (×8): qty 1

## 2011-11-19 MED ORDER — DIPHENHYDRAMINE HCL 50 MG/ML IJ SOLN: 25.0000 mg | INTRAMUSCULAR | Status: DC | PRN

## 2011-11-19 MED ORDER — OXYTOCIN 40 UNITS IN LACTATED RINGERS INFUSION - SIMPLE MED
62.5000 mL/h | Freq: Once | INTRAVENOUS | Status: AC
  Administered 2011-11-19: 62.5 mL/h via INTRAVENOUS

## 2011-11-19 MED ORDER — IBUPROFEN 600 MG PO TABS: 600.0000 mg | ORAL_TABLET | Freq: Four times a day (QID) | ORAL | Status: DC | PRN

## 2011-11-19 MED ORDER — PRENATAL MULTIVITAMIN CH
1.0000 | ORAL_TABLET | Freq: Every day | ORAL | Status: DC
  Administered 2011-11-19 – 2011-11-21 (×3): 1 via ORAL
  Filled 2011-11-19 (×3): qty 1

## 2011-11-19 MED ORDER — SODIUM CHLORIDE 0.9 % IV SOLN
1.0000 ug/kg/h | INTRAVENOUS | Status: DC | PRN
  Filled 2011-11-19: qty 2.5

## 2011-11-19 MED ORDER — FENTANYL CITRATE 0.05 MG/ML IJ SOLN
INTRAMUSCULAR | Status: AC
  Filled 2011-11-19: qty 10

## 2011-11-19 MED ORDER — SIMETHICONE 80 MG PO CHEW: 80.0000 mg | CHEWABLE_TABLET | ORAL | Status: DC | PRN

## 2011-11-19 MED ORDER — ONDANSETRON HCL 4 MG/2ML IJ SOLN: 4.0000 mg | Freq: Three times a day (TID) | INTRAMUSCULAR | Status: DC | PRN

## 2011-11-19 MED ORDER — MEASLES, MUMPS & RUBELLA VAC ~~LOC~~ INJ
0.5000 mL | INJECTION | Freq: Once | SUBCUTANEOUS | Status: AC
  Administered 2011-11-21: 0.5 mL via SUBCUTANEOUS
  Filled 2011-11-19 (×2): qty 0.5

## 2011-11-19 MED ORDER — PHENYLEPHRINE 40 MCG/ML (10ML) SYRINGE FOR IV PUSH (FOR BLOOD PRESSURE SUPPORT)
PREFILLED_SYRINGE | INTRAVENOUS | Status: AC
  Filled 2011-11-19: qty 10

## 2011-11-19 MED ORDER — METOCLOPRAMIDE HCL 5 MG/ML IJ SOLN: 10.0000 mg | Freq: Three times a day (TID) | INTRAMUSCULAR | Status: DC | PRN

## 2011-11-19 MED ORDER — OXYTOCIN 40 UNITS IN LACTATED RINGERS INFUSION - SIMPLE MED: 62.5000 mL/h | INTRAVENOUS | Status: AC

## 2011-11-19 MED ORDER — DIBUCAINE 1 % RE OINT: 1.0000 "application " | TOPICAL_OINTMENT | RECTAL | Status: DC | PRN

## 2011-11-19 MED ORDER — WITCH HAZEL-GLYCERIN EX PADS: 1.0000 "application " | MEDICATED_PAD | CUTANEOUS | Status: DC | PRN

## 2011-11-19 MED ORDER — ONDANSETRON HCL 4 MG/2ML IJ SOLN
INTRAMUSCULAR | Status: AC
  Filled 2011-11-19: qty 2

## 2011-11-19 MED ORDER — HYDROMORPHONE HCL PF 1 MG/ML IJ SOLN
INTRAMUSCULAR | Status: AC
  Filled 2011-11-19: qty 1

## 2011-11-19 MED ORDER — SIMETHICONE 80 MG PO CHEW
80.0000 mg | CHEWABLE_TABLET | Freq: Three times a day (TID) | ORAL | Status: DC
  Administered 2011-11-19 – 2011-11-21 (×9): 80 mg via ORAL

## 2011-11-19 MED ORDER — TETANUS-DIPHTH-ACELL PERTUSSIS 5-2.5-18.5 LF-MCG/0.5 IM SUSP
0.5000 mL | Freq: Once | INTRAMUSCULAR | Status: AC
  Administered 2011-11-21: 0.5 mL via INTRAMUSCULAR

## 2011-11-19 MED ORDER — DIPHENHYDRAMINE HCL 50 MG/ML IJ SOLN: 12.5000 mg | INTRAMUSCULAR | Status: DC | PRN

## 2011-11-19 MED ORDER — ONDANSETRON HCL 4 MG PO TABS: 4.0000 mg | ORAL_TABLET | ORAL | Status: DC | PRN

## 2011-11-19 MED ORDER — NALOXONE HCL 0.4 MG/ML IJ SOLN: 0.4000 mg | INTRAMUSCULAR | Status: DC | PRN

## 2011-11-19 MED ORDER — DIPHENHYDRAMINE HCL 25 MG PO CAPS: 25.0000 mg | ORAL_CAPSULE | ORAL | Status: DC | PRN

## 2011-11-19 MED ORDER — LACTATED RINGERS IV SOLN
INTRAVENOUS | Status: DC
  Administered 2011-11-19: 15:00:00 via INTRAVENOUS

## 2011-11-19 MED ORDER — ROCURONIUM BROMIDE 50 MG/5ML IV SOLN
INTRAVENOUS | Status: AC
  Filled 2011-11-19: qty 1

## 2011-11-19 MED ORDER — ZOLPIDEM TARTRATE 5 MG PO TABS: 5.0000 mg | ORAL_TABLET | Freq: Every evening | ORAL | Status: DC | PRN

## 2011-11-19 MED ORDER — PROPOFOL 10 MG/ML IV EMUL
INTRAVENOUS | Status: AC
  Filled 2011-11-19: qty 20

## 2011-11-19 MED ORDER — MIDAZOLAM HCL 2 MG/2ML IJ SOLN
INTRAMUSCULAR | Status: AC
  Filled 2011-11-19: qty 2

## 2011-11-19 MED ORDER — OXYCODONE-ACETAMINOPHEN 5-325 MG PO TABS
1.0000 | ORAL_TABLET | ORAL | Status: DC | PRN
  Administered 2011-11-19 – 2011-11-20 (×5): 1 via ORAL
  Administered 2011-11-20: 2 via ORAL
  Administered 2011-11-20 – 2011-11-21 (×5): 1 via ORAL
  Filled 2011-11-19 (×3): qty 1
  Filled 2011-11-19: qty 2
  Filled 2011-11-19 (×5): qty 1
  Filled 2011-11-19 (×2): qty 2

## 2011-11-19 MED ORDER — DIPHENHYDRAMINE HCL 25 MG PO CAPS
25.0000 mg | ORAL_CAPSULE | Freq: Four times a day (QID) | ORAL | Status: DC | PRN
  Administered 2011-11-20 – 2011-11-21 (×3): 25 mg via ORAL
  Filled 2011-11-19 (×3): qty 1

## 2011-11-19 NOTE — Anesthesia Postprocedure Evaluation (Signed)
Anesthesia Post Note  Patient: Stephanie Harrington  Procedure(s) Performed: Procedure(s) (LRB): CESAREAN SECTION (N/A)  Anesthesia type: Epidural  Patient location: Mother/Baby  Post pain: Pain level controlled  Post assessment: Post-op Vital signs reviewed  Last Vitals:  Filed Vitals:   11/19/11 0545  BP: 107/66  Pulse: 75  Temp: 36.9 C  Resp: 20    Post vital signs: Reviewed  Level of consciousness: awake  Complications: No apparent anesthesia complications

## 2011-11-19 NOTE — Progress Notes (Signed)
POSTOPERATIVE DAY # 1 S/P cesarean section   S:         Reports feeling tired and itchy             Tolerating po intake / no nausea / no vomiting / no flatus / no BM             Bleeding is light             Pain controlled with long-acting narcotic - started percocet this am             Up ad lib / ambulatory  Newborn breast-feeding     O:  A & O x 3 NAD             VS: Blood pressure 93/56, pulse 58, temperature 98 F (36.7 C), temperature source Oral, resp. rate 16, height 5\' 4"  (1.626 m), weight 70.761 kg (156 lb), last menstrual period 02/08/2011, SpO2 96.00%.  LABS: WBC/Hgb/Hct/Plts:  13.8/9.9/28.5/177 (09/01 0500)   I&O:   + 1368  Lungs: Clear and unlabored  Heart: regular rate and rhythm / no mumurs  Abdomen: soft, non-tender, non-distended, hypoactive BS             Fundus: firm, non-tender, U-1             Dressing intact without drainage             Perineum: mild edema  Lochia: light  Extremities: trace edema, no calf pain or tenderness, SCD in place  A:        POD # 1 S/P cesarean section            Mild ABL anemia   P:        Routine postoperative care              Advance postop orders as tolerated             Consider iron tomorrow after increased bowel motility   Marlinda Mike CNM, MSN 11/19/2011, 9:34 AM

## 2011-11-19 NOTE — Addendum Note (Signed)
Addendum  created 11/19/11 0803 by Jhonnie Garner, CRNA   Modules edited:Notes Section

## 2011-11-20 ENCOUNTER — Encounter (HOSPITAL_COMMUNITY): Payer: Self-pay | Admitting: Obstetrics & Gynecology

## 2011-11-20 NOTE — Progress Notes (Signed)
POSTOPERATIVE DAY # 2 S/P cesarean section   S:         Reports feeling really sore - more pain today             Tolerating po intake / no nausea / no vomiting / + flatus / no BM             Bleeding is light             Pain controlled with motrin and percocet             Up ad lib / ambulatory  Newborn breast-feeding    O:  A & O x 3              VS: Blood pressure 95/59, pulse 49, temperature 98 F (36.7 C), temperature source Oral, resp. rate 18, height 5\' 4"  (1.626 m), weight 70.761 kg (156 lb), last menstrual period 02/08/2011, SpO2 96.00%.  Lungs: Clear and unlabored  Heart: regular rate and rhythm  Abdomen: soft, non-tender, non-distended, active BS             Fundus: firm, non-tender, U-1             Dressing OFF              Incision:  approximated with subcuticular & steri-strips / no erythema / no ecchymosis / no drainage  Perineum: no edema  Lochia: light  Extremities: trace edema, no calf pain or tenderness  A:        POD # 2 S/P cesarean section             P:        Routine postoperative care              Discharge tomorrow   Marlinda Mike CNM, MSN 11/20/2011, 9:45 AM

## 2011-11-21 MED ORDER — IBUPROFEN 600 MG PO TABS: 600.0000 mg | ORAL_TABLET | Freq: Four times a day (QID) | ORAL | Status: AC

## 2011-11-21 MED ORDER — OXYCODONE-ACETAMINOPHEN 5-325 MG PO TABS: 1.0000 | ORAL_TABLET | ORAL | Status: AC | PRN

## 2011-11-21 NOTE — Discharge Summary (Signed)
Reviewed and agree with note V.Lulu Hirschmann, MD 

## 2011-11-21 NOTE — Progress Notes (Signed)
POSTOPERATIVE DAY # 3 S/P cesarean section  S:         Reports feeling well - ready to go home             Tolerating po intake / no nausea / no vomiting / + flatus / no BM             Bleeding is light             Pain controlled with motrin and percocet / more pain right lower quad - consistent with fascial pain             Up ad lib / ambulatory / voiding QS  Newborn breast- feeding    O:  A & O x 3              VS: Blood pressure 126/77, pulse 72, temperature 97.8 F (36.6 C), temperature source Oral, resp. rate 18, height 5\' 4"  (1.626 m), weight 70.761 kg (156 lb), last menstrual period 02/08/2011, SpO2 96.00%, unknown if currently breastfeeding.  Lungs: Clear and unlabored  Heart: regular rate and rhythm / no mumurs  Abdomen: soft, non-tender, non-distended, active BS             Fundus: firm, non-tender, U-2             Dressing OFF             Incision:  approximated with subcuticular & steri-strips / no erythema / no ecchymosis / no drainage  Perineum: no edema  Lochia: light  Extremities: trace pedal edema, no calf pain or tenderness, negative Homans  A:        POD # 3 S/P cesarean section            Mild ABL anemia - hemodynamically stable  P:        Routine postoperative care              Discharge home             WOB booklet - instructions reviewed    Marlinda Mike CNM, MSN 11/21/2011, 9:06 AM

## 2011-11-21 NOTE — Discharge Summary (Signed)
POSTOPERATIVE DISCHARGE SUMMARY:  Patient ID: Stephanie Harrington MRN: [id number] DOB/AGE: 1980-05-23 30 y.o.  Admit date: 11/17/2011 Discharge date:  11/21/2011  Admission Diagnoses: 40 weeks - scheduled elective induction of labor   Discharge Diagnoses:   Term Pregnancy-delivered  POD #3 s/p cesarean section for arrest of labor  mild ABL anemia - hemodynamically stable  Prenatal history: G2P1011   EDC : 11/15/2011, by Last Menstrual Period  [facility]renatal care at [facility]  Primary provider : Mody Prenatal course uncomplicated.  Prenatal Labs: ABO, Rh: A (01/25 0000) positive Antibody: Negative (01/25 0000) Rubella: Nonimmune (01/25 0000)  - Booster given PP with tDap RPR: NON REACTIVE (08/30 2000)  HBsAg: Negative (01/25 0000)  HIV: Non-reactive (01/25 0000)  GBS: Negative (07/30 0000)  1 hr Glucola : NL  Medical / Surgical History :  Past medical history:  Past Medical History  Diagnosis Date  . IBS (irritable bowel syndrome)   . Depression   . Recurrent upper respiratory infection (URI)   . GERD (gastroesophageal reflux disease)     no meds  . Hemorrhoid   . H/O varicella     Past surgical history:  Past Surgical History  Procedure Date  . Tonsillectomy   . Addenoidectomy   . Wisdom tooth extraction   . Dilation and evacuation 12/19/2010    Procedure: DILATATION AND EVACUATION (D&E);  Surgeon: Robley Fries;  Location: [facility];  Service: Gynecology;  Laterality: N/A;  . Cesarean section 11/18/2011    Procedure: CESAREAN SECTION;  Surgeon: Robley Fries, MD;  Location: [facility];  Service: Gynecology;  Laterality: N/A;  Primary cesarean section of baby girl at 2013  APGAR 9/9    Family History:  Family History  Problem Relation Age of Onset  . Heart disease Mother   . Heart attack Mother   . Hyperlipidemia Father   . Hypertension Father   . Cancer Father     prostate  . Cancer Maternal Grandmother     breast  . Heart disease Maternal  Grandmother   . Coronary artery disease Maternal Grandmother     Social History:  reports that she quit smoking about 23 months ago. Her smoking use included Cigarettes. She has a 2.25 pack-year smoking history. She has never used smokeless tobacco. She reports that she does not drink alcohol or use illicit drugs.   Allergies: Review of patient's allergies indicates no known allergies.    Current Medications at time of admission:  Prior to Admission medications   Medication Sig Start Date End Date Taking? Authorizing Provider  lidocaine-hydrocortisone (ANAMANTLE) 3-1 % KIT Place 1 application rectally 2 (two) times daily as needed. For hemorrhoids   Yes Historical Provider, MD  Prenatal Vit-Fe Fumarate-FA (PRENATAL MULTIVITAMIN) TABS Take 1 tablet by mouth at bedtime.   Yes Historical Provider, MD                  Intrapartum Course:  admit for induction of labor  cytotec for cervical ripening followed by pitocin low dose protocol  IV stadol with minimal relief followed by epidural anesthesia for intrapartum pain management AROM - clear fluid / negative GBS progression of labor to 5cm with high fetal station without further progression despite adequate ctx decision for cesarean delivery for arrest of active labor  Procedures: Cesarean section delivery of female newborn by Dr Juliene Pina  See operative report for further details  Postoperative / postpartum course: uneventful - discharge in stable condition POD #3  Physical  Exam:   VSS: Blood pressure 126/77, pulse 72, temperature 97.8 F (36.6 C), temperature source Oral, resp. rate 18, height 5\' 4"  (1.626 m), weight 70.761 kg (156 lb), last menstrual period 02/08/2011, SpO2 96.00%, unknown if currently breastfeeding.  LABS:   preop hgb 14.0 / postop hgb 9.9 General: pleasant / NAD Heart: RRR Lungs: unlabored / clear Abdomen: non-tender / active BS Extremities: trace pedal edema - dependent edema  Incision:  approximated with  subcuticular with steri-strips / no erythema / no ecchymosis / no drainage  Discharge Instructions:  Discharged Condition: stable Activity: pelvic rest and postoperative restrictions Diet: routine Medications: see below Medication List  As of 11/21/2011  9:12 AM   TAKE these medications         ibuprofen 600 MG tablet   Commonly known as: ADVIL,MOTRIN   Take 1 tablet (600 mg total) by mouth every 6 (six) hours.      lidocaine-hydrocortisone 3-1 % Kit   Commonly known as: ANAMANTLE   Place 1 application rectally 2 (two) times daily as needed. For hemorrhoids      oxyCODONE-acetaminophen 5-325 MG per tablet   Commonly known as: PERCOCET/ROXICET   Take 1 tablet by mouth every 4 (four) hours as needed (moderate - severe pain).      prenatal multivitamin Tabs   Take 1 tablet by mouth at bedtime.           Condition: stable Postpartum Instructions: refer to practice specific booklet Discharge to: home Disposition: 01-Home or Self Care Follow up :  Follow-up Information    Follow up with MODY,VAISHALI R, MD. Schedule an appointment as soon as possible for a visit in 6 weeks.   Contact information:   [address] [place] [address] [phone]           Signed: Marlinda Mike CNM, MSN 11/21/2011, 9:12 AM

## 2011-11-21 NOTE — Plan of Care (Signed)
Problem: Discharge Progression Outcomes Goal: MMR given as ordered Outcome: Not Met (add Reason) Needs MMR prior to d/c     

## 2013-05-23 LAB — OB RESULTS CONSOLE RUBELLA ANTIBODY, IGM: RUBELLA: IMMUNE

## 2013-05-26 LAB — OB RESULTS CONSOLE GC/CHLAMYDIA
Chlamydia: NEGATIVE
GC PROBE AMP, GENITAL: NEGATIVE

## 2013-05-26 LAB — OB RESULTS CONSOLE HIV ANTIBODY (ROUTINE TESTING): HIV: NONREACTIVE

## 2013-07-30 ENCOUNTER — Other Ambulatory Visit: Payer: Self-pay

## 2013-11-04 ENCOUNTER — Other Ambulatory Visit: Payer: Self-pay | Admitting: Obstetrics & Gynecology

## 2013-12-09 ENCOUNTER — Encounter (HOSPITAL_COMMUNITY): Payer: Self-pay

## 2013-12-15 ENCOUNTER — Encounter (HOSPITAL_COMMUNITY): Payer: Self-pay

## 2013-12-15 ENCOUNTER — Encounter (HOSPITAL_COMMUNITY)
Admission: RE | Admit: 2013-12-15 | Discharge: 2013-12-15 | Disposition: A | Discharge status: Home / Self Care | Payer: BC Managed Care – PPO | Source: Ambulatory Visit | Attending: Obstetrics & Gynecology | Admitting: Obstetrics & Gynecology

## 2013-12-15 HISTORY — DX: Anemia, unspecified: D64.9

## 2013-12-15 HISTORY — DX: Pneumonia, unspecified organism: J18.9

## 2013-12-15 LAB — CBC
HCT: 40.2 % (ref 36.0–46.0)
Hemoglobin: 14.3 g/dL (ref 12.0–15.0)
MCH: 31.7 pg (ref 26.0–34.0)
MCHC: 35.6 g/dL (ref 30.0–36.0)
MCV: 89.1 fL (ref 78.0–100.0)
PLATELETS: 168 10*3/uL (ref 150–400)
RBC: 4.51 MIL/uL (ref 3.87–5.11)
RDW: 13.2 % (ref 11.5–15.5)
WBC: 9.7 10*3/uL (ref 4.0–10.5)

## 2013-12-15 LAB — TYPE AND SCREEN
ABO/RH(D): A POS
Antibody Screen: NEGATIVE

## 2013-12-15 LAB — RPR

## 2013-12-15 NOTE — Patient Instructions (Signed)
Your procedure is scheduled on:  Wednesday, December 17, 2013  Enter through the [employer] [facility] at:  8:00 A.M.  Pick up the phone at the desk and dial [phone].  Call this number if you have problems the morning of surgery: [phone].  Remember: Do NOT eat food:  AFTER MIDNIGHT TUESDAY Do NOT drink clear liquids after: AFTER MIDNIGHT TUESDAY Take these medicines the morning of surgery with a SIP OF WATER: NONE  Do NOT wear jewelry (body piercing), metal hair clips/bobby pins, or nail polish. Do NOT wear lotions, powders, or perfumes.  You may wear deoderant. Do NOT shave for 48 hours prior to surgery. Do NOT bring valuables to the hospital.  Leave suitcase in car.  After surgery it may be brought to your room.  For patients admitted to the hospital, checkout time is 11:00 AM the day of discharge.

## 2013-12-17 ENCOUNTER — Encounter (HOSPITAL_COMMUNITY)
Admission: AD | Disposition: A | Discharge status: Home / Self Care | Payer: Self-pay | Source: Ambulatory Visit | Attending: Obstetrics & Gynecology

## 2013-12-17 ENCOUNTER — Encounter (HOSPITAL_COMMUNITY): Payer: BC Managed Care – PPO | Admitting: Anesthesiology

## 2013-12-17 ENCOUNTER — Encounter (HOSPITAL_COMMUNITY): Payer: Self-pay | Admitting: *Deleted

## 2013-12-17 ENCOUNTER — Inpatient Hospital Stay (HOSPITAL_COMMUNITY): Payer: BC Managed Care – PPO | Admitting: Anesthesiology

## 2013-12-17 ENCOUNTER — Inpatient Hospital Stay (HOSPITAL_COMMUNITY)
Admission: AD | Admit: 2013-12-17 | Discharge: 2013-12-19 | DRG: 766 | Disposition: A | Discharge status: Home / Self Care | Payer: BC Managed Care – PPO | Source: Ambulatory Visit | Attending: Obstetrics & Gynecology | Admitting: Obstetrics & Gynecology

## 2013-12-17 DIAGNOSIS — Z8249 Family history of ischemic heart disease and other diseases of the circulatory system: Secondary | ICD-10-CM | POA: Diagnosis not present

## 2013-12-17 DIAGNOSIS — K219 Gastro-esophageal reflux disease without esophagitis: Secondary | ICD-10-CM | POA: Diagnosis present

## 2013-12-17 DIAGNOSIS — Z3A40 40 weeks gestation of pregnancy: Secondary | ICD-10-CM | POA: Diagnosis present

## 2013-12-17 DIAGNOSIS — O99613 Diseases of the digestive system complicating pregnancy, third trimester: Secondary | ICD-10-CM | POA: Diagnosis present

## 2013-12-17 DIAGNOSIS — Z98891 History of uterine scar from previous surgery: Secondary | ICD-10-CM

## 2013-12-17 DIAGNOSIS — Z87891 Personal history of nicotine dependence: Secondary | ICD-10-CM | POA: Diagnosis not present

## 2013-12-17 DIAGNOSIS — O3421 Maternal care for scar from previous cesarean delivery: Principal | ICD-10-CM | POA: Diagnosis present

## 2013-12-17 DIAGNOSIS — K589 Irritable bowel syndrome without diarrhea: Secondary | ICD-10-CM | POA: Diagnosis present

## 2013-12-17 SURGERY — Surgical Case
Anesthesia: Spinal | Site: Abdomen

## 2013-12-17 MED ORDER — KETOROLAC TROMETHAMINE 30 MG/ML IJ SOLN
30.0000 mg | Freq: Four times a day (QID) | INTRAMUSCULAR | Status: DC | PRN
  Administered 2013-12-17: 30 mg via INTRAMUSCULAR

## 2013-12-17 MED ORDER — SIMETHICONE 80 MG PO CHEW
80.0000 mg | CHEWABLE_TABLET | Freq: Three times a day (TID) | ORAL | Status: DC
  Administered 2013-12-17 – 2013-12-19 (×5): 80 mg via ORAL
  Filled 2013-12-17 (×5): qty 1

## 2013-12-17 MED ORDER — MORPHINE SULFATE 0.5 MG/ML IJ SOLN
INTRAMUSCULAR | Status: AC
  Filled 2013-12-17: qty 10

## 2013-12-17 MED ORDER — PHENYLEPHRINE 8 MG IN D5W 100 ML (0.08MG/ML) PREMIX OPTIME
INJECTION | INTRAVENOUS | Status: DC | PRN
  Administered 2013-12-17: 60 ug/min via INTRAVENOUS

## 2013-12-17 MED ORDER — SIMETHICONE 80 MG PO CHEW
80.0000 mg | CHEWABLE_TABLET | ORAL | Status: DC | PRN
  Administered 2013-12-18: 80 mg via ORAL

## 2013-12-17 MED ORDER — SCOPOLAMINE 1 MG/3DAYS TD PT72
1.0000 | MEDICATED_PATCH | Freq: Once | TRANSDERMAL | Status: DC
  Filled 2013-12-17: qty 1

## 2013-12-17 MED ORDER — ONDANSETRON HCL 4 MG/2ML IJ SOLN
INTRAMUSCULAR | Status: DC | PRN
  Administered 2013-12-17: 4 mg via INTRAVENOUS

## 2013-12-17 MED ORDER — MORPHINE SULFATE (PF) 0.5 MG/ML IJ SOLN
INTRAMUSCULAR | Status: DC | PRN
  Administered 2013-12-17: .1 mg via EPIDURAL

## 2013-12-17 MED ORDER — DIPHENHYDRAMINE HCL 25 MG PO CAPS: 25.0000 mg | ORAL_CAPSULE | Freq: Four times a day (QID) | ORAL | Status: DC | PRN

## 2013-12-17 MED ORDER — OXYTOCIN 40 UNITS IN LACTATED RINGERS INFUSION - SIMPLE MED: 62.5000 mL/h | INTRAVENOUS | Status: AC

## 2013-12-17 MED ORDER — MEPERIDINE HCL 25 MG/ML IJ SOLN: 6.2500 mg | INTRAMUSCULAR | Status: DC | PRN

## 2013-12-17 MED ORDER — LANOLIN HYDROUS EX OINT: 1.0000 "application " | TOPICAL_OINTMENT | CUTANEOUS | Status: DC | PRN

## 2013-12-17 MED ORDER — LACTATED RINGERS IV SOLN
INTRAVENOUS | Status: DC
  Administered 2013-12-17: 23:00:00 via INTRAVENOUS

## 2013-12-17 MED ORDER — FAMOTIDINE 20 MG PO TABS
ORAL_TABLET | ORAL | Status: AC
  Filled 2013-12-17: qty 1

## 2013-12-17 MED ORDER — FENTANYL CITRATE 0.05 MG/ML IJ SOLN
INTRAMUSCULAR | Status: AC
  Administered 2013-12-17: 50 ug via INTRAVENOUS
  Filled 2013-12-17: qty 2

## 2013-12-17 MED ORDER — LACTATED RINGERS IV SOLN
INTRAVENOUS | Status: DC | PRN
  Administered 2013-12-17: 11:00:00 via INTRAVENOUS

## 2013-12-17 MED ORDER — KETOROLAC TROMETHAMINE 30 MG/ML IJ SOLN
INTRAMUSCULAR | Status: AC
  Administered 2013-12-17: 30 mg via INTRAMUSCULAR
  Filled 2013-12-17: qty 1

## 2013-12-17 MED ORDER — CEFAZOLIN SODIUM-DEXTROSE 2-3 GM-% IV SOLR
INTRAVENOUS | Status: AC
  Filled 2013-12-17: qty 50

## 2013-12-17 MED ORDER — KETOROLAC TROMETHAMINE 30 MG/ML IJ SOLN
30.0000 mg | Freq: Four times a day (QID) | INTRAMUSCULAR | Status: DC | PRN
Start: 2013-12-17 — End: 2013-12-17

## 2013-12-17 MED ORDER — SIMETHICONE 80 MG PO CHEW
80.0000 mg | CHEWABLE_TABLET | ORAL | Status: DC
  Administered 2013-12-19: 80 mg via ORAL
  Filled 2013-12-17 (×2): qty 1

## 2013-12-17 MED ORDER — PHENYLEPHRINE HCL 10 MG/ML IJ SOLN
INTRAMUSCULAR | Status: DC | PRN
  Administered 2013-12-17: 80 ug via INTRAVENOUS

## 2013-12-17 MED ORDER — LACTATED RINGERS IV SOLN: INTRAVENOUS | Status: DC | PRN

## 2013-12-17 MED ORDER — SODIUM CHLORIDE 0.9 % IJ SOLN: 3.0000 mL | INTRAMUSCULAR | Status: DC | PRN

## 2013-12-17 MED ORDER — FENTANYL CITRATE 0.05 MG/ML IJ SOLN
INTRAMUSCULAR | Status: DC | PRN
  Administered 2013-12-17: 25 ug via INTRATHECAL

## 2013-12-17 MED ORDER — ONDANSETRON HCL 4 MG/2ML IJ SOLN
INTRAMUSCULAR | Status: AC
  Filled 2013-12-17: qty 2

## 2013-12-17 MED ORDER — MENTHOL 3 MG MT LOZG: 1.0000 | LOZENGE | OROMUCOSAL | Status: DC | PRN

## 2013-12-17 MED ORDER — NALBUPHINE HCL 10 MG/ML IJ SOLN: 5.0000 mg | INTRAMUSCULAR | Status: DC | PRN

## 2013-12-17 MED ORDER — OXYCODONE-ACETAMINOPHEN 5-325 MG PO TABS
1.0000 | ORAL_TABLET | ORAL | Status: DC | PRN
  Administered 2013-12-19 (×2): 1 via ORAL
  Filled 2013-12-17: qty 1

## 2013-12-17 MED ORDER — DIBUCAINE 1 % RE OINT: 1.0000 "application " | TOPICAL_OINTMENT | RECTAL | Status: DC | PRN

## 2013-12-17 MED ORDER — FERROUS SULFATE 325 (65 FE) MG PO TABS
325.0000 mg | ORAL_TABLET | Freq: Two times a day (BID) | ORAL | Status: DC
  Administered 2013-12-18: 325 mg via ORAL
  Filled 2013-12-17: qty 1

## 2013-12-17 MED ORDER — ACETAMINOPHEN 160 MG/5ML PO SOLN
1000.0000 mg | Freq: Once | ORAL | Status: AC
  Administered 2013-12-17: 1000 mg via ORAL
  Filled 2013-12-17: qty 40.6

## 2013-12-17 MED ORDER — DIPHENHYDRAMINE HCL 50 MG/ML IJ SOLN: 12.5000 mg | INTRAMUSCULAR | Status: DC | PRN

## 2013-12-17 MED ORDER — LACTATED RINGERS IV SOLN
INTRAVENOUS | Status: DC | PRN
  Administered 2013-12-17 (×3): via INTRAVENOUS

## 2013-12-17 MED ORDER — NALBUPHINE HCL 10 MG/ML IJ SOLN: 5.0000 mg | Freq: Once | INTRAMUSCULAR | Status: AC | PRN

## 2013-12-17 MED ORDER — PRENATAL MULTIVITAMIN CH
1.0000 | ORAL_TABLET | Freq: Every day | ORAL | Status: DC
  Filled 2013-12-17: qty 1

## 2013-12-17 MED ORDER — FENTANYL CITRATE 0.05 MG/ML IJ SOLN
INTRAMUSCULAR | Status: AC
  Filled 2013-12-17: qty 2

## 2013-12-17 MED ORDER — PHENYLEPHRINE 8 MG IN D5W 100 ML (0.08MG/ML) PREMIX OPTIME
INJECTION | INTRAVENOUS | Status: AC
  Filled 2013-12-17: qty 100

## 2013-12-17 MED ORDER — TETANUS-DIPHTH-ACELL PERTUSSIS 5-2.5-18.5 LF-MCG/0.5 IM SUSP: 0.5000 mL | Freq: Once | INTRAMUSCULAR | Status: DC

## 2013-12-17 MED ORDER — ONDANSETRON HCL 4 MG/2ML IJ SOLN: 4.0000 mg | INTRAMUSCULAR | Status: DC | PRN

## 2013-12-17 MED ORDER — SCOPOLAMINE 1 MG/3DAYS TD PT72
1.0000 | MEDICATED_PATCH | Freq: Once | TRANSDERMAL | Status: DC
  Administered 2013-12-17: 1.5 mg via TRANSDERMAL

## 2013-12-17 MED ORDER — OXYTOCIN 10 UNIT/ML IJ SOLN
40.0000 [IU] | INTRAMUSCULAR | Status: DC | PRN
  Administered 2013-12-17: 40 [IU] via INTRAVENOUS

## 2013-12-17 MED ORDER — IBUPROFEN 600 MG PO TABS
600.0000 mg | ORAL_TABLET | Freq: Four times a day (QID) | ORAL | Status: DC
  Administered 2013-12-17 – 2013-12-19 (×7): 600 mg via ORAL
  Filled 2013-12-17 (×7): qty 1

## 2013-12-17 MED ORDER — ONDANSETRON HCL 4 MG/2ML IJ SOLN: 4.0000 mg | Freq: Three times a day (TID) | INTRAMUSCULAR | Status: DC | PRN

## 2013-12-17 MED ORDER — FAMOTIDINE 20 MG PO TABS
20.0000 mg | ORAL_TABLET | Freq: Once | ORAL | Status: AC
  Administered 2013-12-17: 20 mg via ORAL

## 2013-12-17 MED ORDER — GLYCOPYRROLATE 0.2 MG/ML IJ SOLN
INTRAMUSCULAR | Status: DC | PRN
  Administered 2013-12-17: 0.2 mg via INTRAVENOUS

## 2013-12-17 MED ORDER — SENNOSIDES-DOCUSATE SODIUM 8.6-50 MG PO TABS
2.0000 | ORAL_TABLET | ORAL | Status: DC
  Administered 2013-12-18 – 2013-12-19 (×2): 2 via ORAL
  Filled 2013-12-17 (×2): qty 2

## 2013-12-17 MED ORDER — DEXTROSE 5 % IV SOLN
1.0000 ug/kg/h | INTRAVENOUS | Status: DC | PRN
  Filled 2013-12-17: qty 2

## 2013-12-17 MED ORDER — DIPHENHYDRAMINE HCL 25 MG PO CAPS: 25.0000 mg | ORAL_CAPSULE | ORAL | Status: DC | PRN

## 2013-12-17 MED ORDER — FENTANYL CITRATE 0.05 MG/ML IJ SOLN
25.0000 ug | INTRAMUSCULAR | Status: DC | PRN
  Administered 2013-12-17 (×2): 50 ug via INTRAVENOUS

## 2013-12-17 MED ORDER — OXYCODONE-ACETAMINOPHEN 5-325 MG PO TABS
2.0000 | ORAL_TABLET | ORAL | Status: DC | PRN
  Administered 2013-12-18 – 2013-12-19 (×6): 2 via ORAL
  Filled 2013-12-17 (×7): qty 2

## 2013-12-17 MED ORDER — LACTATED RINGERS IV SOLN
Freq: Once | INTRAVENOUS | Status: AC
  Administered 2013-12-17: 09:00:00 via INTRAVENOUS

## 2013-12-17 MED ORDER — CEFAZOLIN SODIUM-DEXTROSE 2-3 GM-% IV SOLR
2.0000 g | INTRAVENOUS | Status: AC
  Administered 2013-12-17: 2 g via INTRAVENOUS

## 2013-12-17 MED ORDER — NALOXONE HCL 0.4 MG/ML IJ SOLN: 0.4000 mg | INTRAMUSCULAR | Status: DC | PRN

## 2013-12-17 MED ORDER — OXYTOCIN 10 UNIT/ML IJ SOLN
INTRAMUSCULAR | Status: AC
  Filled 2013-12-17: qty 4

## 2013-12-17 MED ORDER — SCOPOLAMINE 1 MG/3DAYS TD PT72
MEDICATED_PATCH | TRANSDERMAL | Status: AC
  Administered 2013-12-17: 1.5 mg via TRANSDERMAL
  Filled 2013-12-17: qty 1

## 2013-12-17 MED ORDER — WITCH HAZEL-GLYCERIN EX PADS: 1.0000 "application " | MEDICATED_PAD | CUTANEOUS | Status: DC | PRN

## 2013-12-17 MED ORDER — ONDANSETRON HCL 4 MG PO TABS: 4.0000 mg | ORAL_TABLET | ORAL | Status: DC | PRN

## 2013-12-17 MED ORDER — INFLUENZA VAC SPLIT QUAD 0.5 ML IM SUSY
0.5000 mL | PREFILLED_SYRINGE | INTRAMUSCULAR | Status: AC
  Administered 2013-12-18: 0.5 mL via INTRAMUSCULAR
  Filled 2013-12-17: qty 0.5

## 2013-12-17 MED ORDER — ZOLPIDEM TARTRATE 5 MG PO TABS: 5.0000 mg | ORAL_TABLET | Freq: Every evening | ORAL | Status: DC | PRN

## 2013-12-17 SURGICAL SUPPLY — 41 items
APL SKNCLS STERI-STRIP NONHPOA (GAUZE/BANDAGES/DRESSINGS) ×1
BENZOIN TINCTURE PRP APPL 2/3 (GAUZE/BANDAGES/DRESSINGS) ×3 IMPLANT
CLAMP CORD UMBIL (MISCELLANEOUS) IMPLANT
CLOSURE WOUND 1/2 X4 (GAUZE/BANDAGES/DRESSINGS) ×1
CLOTH BEACON ORANGE TIMEOUT ST (SAFETY) ×3 IMPLANT
CONTAINER PREFILL 10% NBF 15ML (MISCELLANEOUS) IMPLANT
COVER LIGHT HANDLE  1/PK (MISCELLANEOUS) ×4
COVER LIGHT HANDLE 1/PK (MISCELLANEOUS) ×2 IMPLANT
DRAPE SHEET LG 3/4 BI-LAMINATE (DRAPES) ×3 IMPLANT
DRSG OPSITE POSTOP 4X10 (GAUZE/BANDAGES/DRESSINGS) ×3 IMPLANT
DURAPREP 26ML APPLICATOR (WOUND CARE) ×3 IMPLANT
ELECT REM PT RETURN 9FT ADLT (ELECTROSURGICAL) ×3
ELECTRODE REM PT RTRN 9FT ADLT (ELECTROSURGICAL) ×1 IMPLANT
EXTRACTOR VACUUM KIWI (MISCELLANEOUS) IMPLANT
EXTRACTOR VACUUM M CUP 4 TUBE (SUCTIONS) IMPLANT
EXTRACTOR VACUUM M CUP 4' TUBE (SUCTIONS)
GLOVE BIO SURGEON STRL SZ7 (GLOVE) ×3 IMPLANT
GLOVE BIOGEL PI IND STRL 7.0 (GLOVE) ×1 IMPLANT
GLOVE BIOGEL PI INDICATOR 7.0 (GLOVE) ×2
GOWN STRL REUS W/TWL LRG LVL3 (GOWN DISPOSABLE) ×6 IMPLANT
KIT ABG SYR 3ML LUER SLIP (SYRINGE) IMPLANT
NEEDLE HYPO 25X5/8 SAFETYGLIDE (NEEDLE) IMPLANT
NS IRRIG 1000ML POUR BTL (IV SOLUTION) ×3 IMPLANT
PACK C SECTION WH (CUSTOM PROCEDURE TRAY) ×3 IMPLANT
PAD OB MATERNITY 4.3X12.25 (PERSONAL CARE ITEMS) ×3 IMPLANT
RTRCTR C-SECT PINK 25CM LRG (MISCELLANEOUS) IMPLANT
STAPLER VISISTAT 35W (STAPLE) IMPLANT
STRIP CLOSURE SKIN 1/2X4 (GAUZE/BANDAGES/DRESSINGS) ×2 IMPLANT
SUT MON AB-0 CT1 36 (SUTURE) ×9 IMPLANT
SUT PLAIN 0 NONE (SUTURE) IMPLANT
SUT PLAIN 2 0 (SUTURE)
SUT PLAIN ABS 2-0 CT1 27XMFL (SUTURE) IMPLANT
SUT VIC AB 0 CT1 27 (SUTURE) ×6
SUT VIC AB 0 CT1 27XBRD ANBCTR (SUTURE) ×2 IMPLANT
SUT VIC AB 2-0 CT1 27 (SUTURE) ×6
SUT VIC AB 2-0 CT1 TAPERPNT 27 (SUTURE) ×2 IMPLANT
SUT VIC AB 4-0 KS 27 (SUTURE) ×3 IMPLANT
SUT VICRYL 0 TIES 12 18 (SUTURE) IMPLANT
TOWEL OR 17X24 6PK STRL BLUE (TOWEL DISPOSABLE) ×3 IMPLANT
TRAY FOLEY CATH 14FR (SET/KITS/TRAYS/PACK) IMPLANT
WATER STERILE IRR 1000ML POUR (IV SOLUTION) ×3 IMPLANT

## 2013-12-17 NOTE — Lactation Note (Signed)
This note was copied from the chart of Stephanie Harrington. Lactation Consultation Note Initial visit at 6 hours of age.  Mom reports baby has been sleepy.  She has several visitors, one holding baby asleep.  Mom report being worried about supply with older child and then only pumped, but learned she was over producing.  Encouraged exclusive breastfeeding for the 1st 2 weeks to establish supply and then to slowly add a pumping session.  Mom reports she is able to hand express with colostrum visible, discussed spoon feeding if needed.  [facility] resources given and discussed.  Encouraged to feed with early cues on demand.  Encouraged to wake baby every 2 1/2 - 3 hours for STS if baby remains sleepy.  Early newborn behavior discussed. Mom to call for assist as needed.    Patient Name: Stephanie Waynetta SandyMichelle Wronski UJWJX'BToday's Date: 12/17/2013 Reason for consult: Initial assessment   Maternal Data Has patient been taught Hand Expression?: Yes Does the patient have breastfeeding experience prior to this delivery?: Yes  Feeding    LATCH Score/Interventions                Intervention(s): Breastfeeding basics reviewed     Lactation Tools Discussed/Used     Consult Status Consult Status: Follow-up Date: 12/18/13 Follow-up type: In-patient    Shoptaw, Arvella MerlesJana Lynn 12/17/2013, 5:22 PM

## 2013-12-17 NOTE — Op Note (Signed)
Cesarean Section Procedure Note   Griffin DakinMichelle D Spradley 12/17/2013  Indications: Scheduled Proceedure/Maternal Request, prior C/s.  Procedure: Repeat Low Transverse Cesarean Section  Pre-operative Diagnosis: Previous Cesarean Section, 40 wks.   Post-operative Diagnosis: Same   Surgeon:  Robley FriesVaishali R Glendi Mohiuddin, MD  Assistants: Raelyn Moraolitta Dawson, CNM  Anesthesia: spinal   Procedure Details:  The patient was seen in the Holding Room. The risks, benefits, complications, treatment options, and expected outcomes were discussed with the patient. The patient concurred with the proposed plan, giving informed consent. identified as Griffin DakinMichelle D Fishman and the procedure verified as C-Section Delivery. A Time Out was held and the above information confirmed.  After induction of anesthesia, the patient was draped and prepped in the usual sterile manner. Foley placed. 2 gm Ancef given.  A Pfannenstiel Incision was made and carried down through the subcutaneous tissue to the fascia. Fascial incision was made and extended transversely. The fascia was separated from the underlying rectus tissue superiorly and inferiorly. The peritoneum was identified and entered. Peritoneal incision was extended longitudinally. The utero-vesical peritoneal reflection was incised transversely and the bladder flap was bluntly freed from the lower uterine segment. A low transverse uterine incision was made. Clear amniotic fluid noted at amniotomy. Delivered from cephalic presentation was a healthy female infant at 11.03 am with Apgar scores of 8 at one minute and 9 at five minutes. Cord ph was not sent the umbilical cord was clamped and cut cord blood was obtained for evaluation. The placenta was removed Intact and appeared normal. The uterine outline, tubes and ovaries appeared normal. The uterine incision was closed with running locked sutures of 0 Monocryl in 2 layers.   Hemostasis was observed. Peritoneal closure done with 2-0 Vicryl.  The fascia was  then reapproximated with running sutures of 0Vicryl. The subcuticular closure was performed using 4-0Vicryl. Sterile dressing placed.   Instrument, sponge, and needle counts were correct prior the abdominal closure and were correct at the conclusion of the case.   Findings:  [facility] at 11.03 am on 12/17/13, clear fluid. Apgars 8 and 9. Normal uterus, tubes, ovaries and placenta and cord.    Estimated Blood Loss: 700 cc  Total IV Fluids:  2400 ml LR  Urine Output: 150CC OF clear urine  Specimens:  Cord blood   Complications: no complications  Disposition: PACU - hemodynamically stable.   Maternal Condition: stable   Baby condition / location:  Couplet care / Skin to Skin  Attending Attestation: I performed the procedure.   Signed: Surgeon(s): Robley FriesVaishali R Jamara Vary, MD

## 2013-12-17 NOTE — H&P (Signed)
Stephanie Harrington is a 33 y.o. female presenting for repeat C/s at 40 wks. No UCs/ pain/ bleeding/ leaking. Feels good FMs. [facility]. Uncomplicated pregnancy, early placenta previa resolved at 28 wks. Marginal cord with good interval growth. GERD, mild anemia, fatigue that improved with iron and vit D. Prior C/s for failure to descent, desires repeat C/section.   History OB History   Grav Para Term Preterm Abortions TAB SAB Ect Mult Living   3 1 1  1  1   1      Past Medical History  Diagnosis Date  . IBS (irritable bowel syndrome)   . Depression   . Recurrent upper respiratory infection (URI)   . GERD (gastroesophageal reflux disease)     no meds  . Hemorrhoid   . H/O varicella   . Pneumonia     walking pneumonia in childhood  . Anemia     childhood   Past Surgical History  Procedure Laterality Date  . Tonsillectomy    . Addenoidectomy    . Wisdom tooth extraction    . Dilation and evacuation  12/19/2010    Procedure: DILATATION AND EVACUATION (D&E);  Surgeon: Robley FriesVaishali R Naren Harrington;  Location: [facility];  Service: Gynecology;  Laterality: N/A;  . Cesarean section  11/18/2011    Procedure: CESAREAN SECTION;  Surgeon: Robley FriesVaishali R Wavie Hashimi, MD;  Location: [facility];  Service: Gynecology;  Laterality: N/A;  Primary cesarean section of baby girl at 2013  APGAR 9/9  . Colonoscopy     Family History: family history includes Cancer in her father and maternal grandmother; Coronary artery disease in her maternal grandmother; Heart attack in her mother; Heart disease in her maternal grandmother and mother; Hyperlipidemia in her father; Hypertension in her father. Social History:  reports that she quit smoking about 4 years ago. Her smoking use included Cigarettes. She has a 2.25 pack-year smoking history. She has never used smokeless tobacco. She reports that she drinks alcohol. She reports that she does not use illicit drugs.   Prenatal Transfer Tool  Maternal Diabetes:  No Genetic Screening: Normal Maternal Ultrasounds/Referrals: Normal Fetal Ultrasounds or other Referrals:  None Maternal Substance Abuse:  No Significant Maternal Medications:  None Significant Maternal Lab Results:  Lab values include: Group B Strep negative Other Comments:  None  ROS  neg    Last menstrual period 03/16/2013, unknown if currently breastfeeding. Exam Physical Exam   A&O x 3, no acute distress. Pleasant HEENT neg, no thyromegaly Lungs CTA bilat CV RRR, S1S2 normal Abdo soft, non tender, non acute Extr no edema/ tenderness Pelvic deferred FHT  140s Toco none  Prenatal labs: ABO, Rh: --/--/A POS (09/28 1104) Antibody: NEG (09/28 1104) Rubella:  Immune RPR: NON REAC (09/28 1100)  HBsAg:   neg HIV:   neg GBS:   neg  Assessment/Plan: 33 yo, G3P1011 at 40 wks, here for repeat C-section.  Declined TL.  Risks/complications of surgery reviewed incl infection, bleeding, damage to internal organs including bladder, bowels, ureters, blood vessels, other risks from anesthesia, VTE and delayed complications of any surgery, complications in future surgery reviewed. Also discussed neonatal complications incl difficult delivery, laceration, vacuum assistance, TTN etc. Pt understands and agrees, all concerns addressed.     Cesare [name] 12/17/2013, 3:05 AM

## 2013-12-17 NOTE — Anesthesia Preprocedure Evaluation (Signed)
Anesthesia Evaluation  Patient identified by MRN, date of birth, ID band Patient awake    Reviewed: Allergy & Precautions, H&P , Patient's Chart, lab work & pertinent test results  Airway Mallampati: II  TM Distance: >3 FB Neck ROM: full    Dental no notable dental hx.    Pulmonary former smoker,  breath sounds clear to auscultation  Pulmonary exam normal       Cardiovascular Exercise Tolerance: Good Rhythm:regular Rate:Normal     Neuro/Psych    GI/Hepatic   Endo/Other    Renal/GU      Musculoskeletal   Abdominal   Peds  Hematology   Anesthesia Other Findings   Reproductive/Obstetrics                             Anesthesia Physical Anesthesia Plan  ASA: II  Anesthesia Plan: Spinal   Post-op Pain Management:    Induction:   Airway Management Planned:   Additional Equipment:   Intra-op Plan:   Post-operative Plan:   Informed Consent: I have reviewed the patients History and Physical, chart, labs and discussed the procedure including the risks, benefits and alternatives for the proposed anesthesia with the patient or authorized representative who has indicated his/her understanding and acceptance.   Dental Advisory Given  Plan Discussed with: CRNA  Anesthesia Plan Comments: (Lab work confirmed with CRNA in room. Platelets okay. Discussed spinal anesthetic, and patient consents to the procedure:  included risk of possible headache,backache, failed block, allergic reaction, and nerve injury. This patient was asked if she had any questions or concerns before the procedure started. )        Anesthesia Quick Evaluation  

## 2013-12-17 NOTE — Anesthesia Postprocedure Evaluation (Signed)
  Anesthesia Post-op Note  Patient: Stephanie Harrington  Procedure(s) Performed: Procedure(s) with comments: Repeat CESAREAN SECTION (N/A) - EDD: 12/17/13  Patient is awake, responsive, moving her legs, and has signs of resolution of her numbness. Pain and nausea are reasonably well controlled. Vital signs are stable and clinically acceptable. Oxygen saturation is clinically acceptable. There are no apparent anesthetic complications at this time. Patient is ready for discharge.

## 2013-12-17 NOTE — Anesthesia Procedure Notes (Signed)

## 2013-12-17 NOTE — Transfer of Care (Signed)
Immediate Anesthesia Transfer of Care Note  Patient: Stephanie DakinMichelle D Harrington  Procedure(s) Performed: Procedure(s) with comments: Repeat CESAREAN SECTION (N/A) - EDD: 12/17/13  Patient Location: PACU  Anesthesia Type:Spinal  Level of Consciousness: awake, alert  and oriented  Airway & Oxygen Therapy: Patient Spontanous Breathing  Post-op Assessment: Report given to PACU RN and Post -op Vital signs reviewed and stable  Post vital signs: Reviewed and stable  Complications: No apparent anesthesia complications

## 2013-12-18 ENCOUNTER — Encounter (HOSPITAL_COMMUNITY): Payer: Self-pay | Admitting: Obstetrics & Gynecology

## 2013-12-18 LAB — CBC
HEMATOCRIT: 35.6 % — AB (ref 36.0–46.0)
HEMOGLOBIN: 12.4 g/dL (ref 12.0–15.0)
MCH: 30.8 pg (ref 26.0–34.0)
MCHC: 34.8 g/dL (ref 30.0–36.0)
MCV: 88.6 fL (ref 78.0–100.0)
Platelets: 176 10*3/uL (ref 150–400)
RBC: 4.02 MIL/uL (ref 3.87–5.11)
RDW: 13.2 % (ref 11.5–15.5)
WBC: 11.2 10*3/uL — ABNORMAL HIGH (ref 4.0–10.5)

## 2013-12-18 LAB — BIRTH TISSUE RECOVERY COLLECTION (PLACENTA DONATION)

## 2013-12-18 NOTE — Addendum Note (Signed)
Addendum created 12/18/13 0816 by Jhonnie GarnerBeth M Cherolyn Behrle, CRNA   Modules edited: Notes Section   Notes Section:  File: [id number]

## 2013-12-18 NOTE — Progress Notes (Signed)
POD # 1  Subjective: Pt reports feeling well/ Pain controlled with Motrin and Percocet Tolerating po/ Voiding without problems/ No n/v/ Flatus absent Activity: ad lib Bleeding is light Newborn info:  Information for the patient's newborn:  Kennyth LoseLee, Girl Znya [[id number]][[id number]]  female Feeding: breast   Objective:  VS:  Filed Vitals:   12/17/13 2314 12/18/13 0030 12/18/13 0430 12/18/13 0830  BP: 103/55 104/64 104/64 113/61  Pulse: 51 53 55 50  Temp: 98.6 F (37 C) 98.6 F (37 C) 98.6 F (37 C) 97.5 F (36.4 C)  TempSrc: Oral Oral Oral Oral  Resp: 16 17 18 20   Weight:      SpO2:  94% 94% 94%     I&O: Intake/Output     09/30 0701 - 10/01 0700 10/01 0701 - 10/02 0700   P.O. 1440 360   I.V. (mL/kg) 3025 (43.7)    Total Intake(mL/kg) 4465 (64.5) 360 (5.2)   Urine (mL/kg/hr) 2400 400 (1)   Blood 700    Total Output 3100 400   Net +1365 -40           Recent Labs  12/18/13 0610  WBC 11.2*  HGB 12.4  HCT 35.6*  PLT 176    Blood type: --/--/A POS (09/28 1104) Rubella: Immune (03/06 0000)    Physical Exam:  General: alert and cooperative CV: Regular rate and rhythm Resp: CTA bilaterally Abdomen: soft, nontender, normal bowel sounds Incision: healing well, no drainage, no erythema, no hernia, no seroma, no swelling, well approximated, honeycomb dsg c/d/i Uterine Fundus: firm, below umbilicus, nontender Lochia: minimal Ext: extremities normal, atraumatic, no cyanosis or edema and Homans sign is negative, no sign of DVT    Assessment: POD # 1/ G3P2012/ S/P C/Section d/t repeat Doing well  Plan: Ambulate Continue routine post op orders   Signed: Donette LarryBHAMBRI, Quita Mcgrory, N, MSN, CNM 12/18/2013, 1:04 PM

## 2013-12-18 NOTE — Anesthesia Postprocedure Evaluation (Signed)
Anesthesia Post Note  Patient: Stephanie Harrington  Procedure(s) Performed: Procedure(s) (LRB): Repeat CESAREAN SECTION (N/A)  Anesthesia type: SAB  Patient location: Mother/Baby  Post pain: Pain level controlled  Post assessment: Post-op Vital signs reviewed  Last Vitals:  Filed Vitals:   12/18/13 0430  BP: 104/64  Pulse: 55  Temp: 37 C  Resp: 18    Post vital signs: Reviewed  Level of consciousness: awake  Complications: No apparent anesthesia complications

## 2013-12-18 NOTE — Progress Notes (Signed)
Clinical Social Work Department PSYCHOSOCIAL ASSESSMENT - MATERNAL/CHILD 12/18/2013  Patient:  Stephanie Harrington,Stephanie Harrington  Account Number:  [id number]  Admit Date:  12/17/2013  Childs Name:   Josephine "Josie" Schneller   Clinical Social Worker:  Milton Streicher, CLINICAL SOCIAL WORKER   Date/Time:  12/18/2013 09:30 AM  Date Referred:  12/17/2013   Referral source  Central Nurs[facility]     Referred reason  Depression/Anxiety   Other referral source:    I:  FAMILY / HOME ENVIRONMENT Child's legal guardian:  PARENT  Guardian - Name Guardian - Age Guardian - Address  Mallie Falzon [address] [place], [place] [address]  Brian Valent  same as above   Other household support members/support persons Name Relationship DOB  Reese DAUGHTER August 2013   Other support:   MOB and FOB shared that they a large support system, including numerous family members who live nearby.  Per MOB, they are well supported.    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   Financial resources:  Private Insurance If Medicaid - County:    School / Grade:  N/A Maternity Care Coordinator / Child Services Coordination / Early Interventions:   N/A  Cultural issues impacting care:   None reported.    III  STRENGTHS Strengths  Adequate Resources  Home prepared for Child (including basic supplies)  Supportive family/friends   Strength comment:    IV  RISK FACTORS AND CURRENT PROBLEMS Current Problem:  YES   Risk Factor & Current Problem Patient Issue Family Issue Risk Factor / Current Problem Comment  Mental Illness N N Per MOB, she experienced mild postpartum depression after her first baby.  She denied that symptoms extended for long period of time.    V  SOCIAL WORK  ASSESSMENT CSW met with the MOB in her room in order to complete the assessment. Consult was ordered due to MOB presenting with a history of mild postpartum depression.  MOB provided consent for the FOB to be present for the assessment. MOB and FOB were easily engaged and receptive to the visit.  MOB displayed full range in affect and presented in a pleasant mood.  The MOB and the FOB smiled frequently, and both were observed to be attentive and putting forth effort to bond with the baby.  MOB openly discussed her experiences with postpartum depression.  CSW did not observe any acute mental health symptoms, and MOB and FOB thanked CSW for the visit.   MOB and FOB smiled and reflected upon their feelings of excitement upon the arrival of their second daughter.  They expressed gratitude for the support of their family and friends, and shared that they are looking forward to returning home.  MOB stated that she will be staying at home during this upcoming year, and she is looking forward to spending time with her daughters.  MOB stated that she stayed at home after her first daughter, she has already made the adjustment from working to home before, and is aware of the process of disengaging from a work identity.  MOB presented with self-awareness of importance of not isolating and remaining active during the postpartum period. She stated that she is involved in support groups for mothers and actively participates by getting out   of the home with other mothers and their children. MOB stated that this was helpful after her 1st daughter was born since it reduced isolation and assisted her to normalize her feelings.    MOB and CSW review postpartum mental health after her first child.  MOB stated that she spoke to her doctor about her feelings since she was unsure if it was the "baby blues" or postpartum depression.  MOB shared belief that it was only the baby blues since they lasted for only a short period of time.   She stated that she was never prescribed medication and that symptoms did not extended beyond the postpartum period.  CSW continued to assist MOB normalize normative feelings of anxiety associated with being a first time mother.  MOB shared that she is less anxious with this daughter and she denied concerns related to the transition into the postpartum period.  She stated that she is very aware of talking to a medical provider if she experiences symptoms since she has seen her mother struggle with depression for years and is aware of the challenges that can be experienced with untreated depression.  MOB denied any stigma with talking about depression, and shared that she will be able to talk to the FOB and her friends who also have children who are the same as hers.   No barriers to discharge.   VI SOCIAL WORK PLAN Social Work Plan  Patient/Family Education  No Further Intervention Required / No Barriers to Discharge   Type of pt/family education:   Postpartum depression and anxiety   If child protective services report - county:   If child protective services report - date:   Information/referral to community resources comment:   No referrals needed at this time.   Other social work plan:   CSW to provide ongoing emotional support PRN.     

## 2013-12-19 MED ORDER — IBUPROFEN 600 MG PO TABS: 600.0000 mg | ORAL_TABLET | Freq: Four times a day (QID) | ORAL | Status: DC

## 2013-12-19 MED ORDER — OXYCODONE-ACETAMINOPHEN 5-325 MG PO TABS: 1.0000 | ORAL_TABLET | ORAL | Status: DC | PRN

## 2013-12-19 NOTE — Progress Notes (Signed)
POD # 2  Subjective: Pt reports feeling well/ Pain controlled with Motrin and Percocet Tolerating po/ Voiding without problems/ No n/v/ Flatus present Activity: ad lib Bleeding is light Newborn info:  Information for the patient's newborn:  Kennyth LoseLee, Girl Jonisha [[id number]][[id number]]  female  Feeding: breast   Objective:  VS:  Filed Vitals:   12/18/13 0830 12/18/13 1417 12/18/13 1753 12/19/13 0630  BP: 113/61 113/68 113/77 111/69  Pulse: 50 56 53 63  Temp: 97.5 F (36.4 C) 98.2 F (36.8 C) 98.3 F (36.8 C) 98.5 F (36.9 C)  TempSrc: Oral Oral Oral Oral  Resp: 20 20 20 18   Weight:      SpO2: 94%   97%     I&O: Intake/Output     10/01 0701 - 10/02 0700 10/02 0701 - 10/03 0700   P.O. 360    I.V. (mL/kg)     Total Intake(mL/kg) 360 (5.2)    Urine (mL/kg/hr) 400 (0.2)    Blood     Total Output 400     Net -40             Recent Labs  12/18/13 0610  WBC 11.2*  HGB 12.4  HCT 35.6*  PLT 176    Blood type: A POS (09/28 1104) Rubella: Immune (03/06 0000)    Physical Exam:  General: alert and cooperative Abdomen: soft, nontender, normal bowel sounds Incision: healing well, no drainage, no erythema, no hernia, no seroma, no swelling, well approximated, honeycomb dsg c/d/i Uterine Fundus: firm, 2FB below umbilicus, nontender Lochia: minimal Ext: extremities normal, atraumatic, no cyanosis or edema and Homans sign is negative, no sign of DVT    Assessment: POD # 2 / [id number] / S/P C/Section d/t repeat  Doing well  Plan: Ambulate Continue routine post op orders Desires early discharge  Signed: Kenard GowerAWSON, Seydina Holliman, M, MSN, CNM 12/19/2013, 10:23 AM

## 2013-12-19 NOTE — Discharge Summary (Signed)
POSTOPERATIVE DISCHARGE SUMMARY:  Patient ID: Stephanie DakinMichelle D Hackworth MRN: [id number] DOB/AGE: [birth year] 32 y.o.  Admit date: 12/17/2013 Admission Diagnoses: Repeat Cesarean Delivery   Discharge date:   Discharge Diagnoses: S/P C/S due to Repeat on 12/17/2013        Prenatal history: G3P2012   EDC : 12/21/2013, by Last Menstrual Period  [facility] since 10.[redacted] wks gestation. Primary provider : Dr. Juliene PinaMody Prenatal course complicated by early placenta previa resolved at 28 wks. Marginal cord with good interval growth. GERD, mild anemia, fatigue that improved with iron and vit D. Prior C/s for failure to descent   Prenatal Labs: ABO, Rh: --/--/A POS (09/28 1104) Antibody: NEG (09/28 1104) Rubella: Immune (03/06 0000)  RPR: NON REAC (09/28 1100)  HBsAg:   NON REAC HIV: Non-reactive (03/09 0000)  GTT : Normal GBS:   Negative  Medical / Surgical History :  Past medical history:  Past Medical History  Diagnosis Date  . IBS (irritable bowel syndrome)   . Recurrent upper respiratory infection (URI)   . GERD (gastroesophageal reflux disease)     no meds  . Hemorrhoid   . H/O varicella   . Pneumonia     walking pneumonia in childhood  . Anemia     childhood  . Depression   . Postpartum care following cesarean delivery (9/30) 12/17/2013    Past surgical history:  Past Surgical History  Procedure Laterality Date  . Tonsillectomy    . Addenoidectomy    . Wisdom tooth extraction    . Dilation and evacuation  12/19/2010    Procedure: DILATATION AND EVACUATION (D&E);  Surgeon: Robley FriesVaishali R Mody;  Location: [facility];  Service: Gynecology;  Laterality: N/A;  . Cesarean section  11/18/2011    Procedure: CESAREAN SECTION;  Surgeon: Robley FriesVaishali R Mody, MD;  Location: [facility];  Service: Gynecology;  Laterality: N/A;  Primary cesarean section of baby girl at 2013  APGAR 9/9  . Colonoscopy    . Cesarean section N/A 12/17/2013    Procedure: Repeat CESAREAN  SECTION;  Surgeon: Robley FriesVaishali R Mody, MD;  Location: [facility];  Service: Obstetrics;  Laterality: N/A;  EDD: 12/17/13     Allergies: Review of patient's allergies indicates no known allergies.   Intrapartum Course:  Admitted for repeat cesarean delivery by Dr. Juliene PinaMody / see operative report  Physical Exam:   VSS: Blood pressure 111/69, pulse 63, temperature 98.5 F (36.9 C), temperature source Oral, resp. rate 18, weight 69.174 kg (152 lb 8 oz), last menstrual period 03/16/2013, SpO2 97.00%, unknown if currently breastfeeding.  LABS:  Recent Labs  12/18/13 0610  WBC 11.2*  HGB 12.4  PLT 176    Newborn Data Live born female  Birth Weight: 7 lb 7.6 oz (3390 g) APGAR: 8, 9  See operative report for further details  Home with mother.  Discharge Instructions:  Wound Care: keep clean and dry / remove honeycomb POD 7 Postpartum Instructions: [facility] discharge booklet - instructions reviewed Medications:    Medication List         diphenhydramine-acetaminophen 25-500 MG Tabs  Commonly known as:  TYLENOL PM  Take 1 tablet by mouth at bedtime as needed.     ibuprofen 600 MG tablet  Commonly known as:  ADVIL,MOTRIN  Take 1 tablet (600 mg total) by mouth every 6 (six) hours.     oxyCODONE-acetaminophen 5-325 MG per tablet  Commonly known as:  PERCOCET/ROXICET  Take 1 tablet by mouth  every 4 (four) hours as needed (for pain scale less than 7).     prenatal multivitamin Tabs tablet  Take 1 tablet by mouth at bedtime.           Follow-up Information   Follow up with MODY,VAISHALI R, MD. Schedule an appointment as soon as possible for a visit in 6 weeks. (postpartum visit)    Specialty:  Obstetrics and Gynecology   Contact information:   [address] [place] [address] [phone]         Signed: Kenard Gower, MSN, CNM 12/19/2013, 10:27 AM

## 2013-12-19 NOTE — Discharge Instructions (Signed)
Breast Pumping Tips °If you are breastfeeding, there may be times when you cannot feed your baby directly. Returning to work or going on a trip are common examples. Pumping allows you to store breast milk and feed it to your baby later.  °You may not get much milk when you first start to pump. Your breasts should start to make more after a few days. If you pump at the times you usually feed your baby, you may be able to keep making enough milk to feed your baby without also using formula. The more often you pump, the more milk you will produce.  °WHEN SHOULD I PUMP?  °· You can begin to pump soon after delivery. However, some experts recommend waiting about 4 weeks before giving your infant a bottle to make sure breastfeeding is going well.  °· If you plan to return to work, begin pumping a few weeks before. This will help you develop techniques that work best for you. It also lets you build up a supply of breast milk.   °· When you are with your infant, feed on demand and pump after each feeding.   °· When you are away from your infant for several hours, pump for about 15 minutes every 2-3 hours. Pump both breasts at the same time if you can.   °· If your infant has a formula feeding, make sure to pump around the same time.     °· If you drink any alcohol, wait 2 hours before pumping.   °HOW DO I PREPARE TO PUMP? °Your let-down reflex is the natural reaction to stimulation that makes your breast milk flow. It is easier to stimulate this reflex when you are relaxed. Find relaxation techniques that work for you. If you have difficulty with your let-down reflex, try these methods:  °· Smell one of your infant's blankets or an item of clothing.   °· Look at a picture or video of your infant.   °· Sit in a quiet, private space.   °· Massage the breast you plan to pump.   °· Place soothing warmth on the breast.   °· Play relaxing music.   °WHAT ARE SOME GENERAL BREAST PUMPING TIPS? °· Wash your hands before you pump. You  do not need to wash your nipples or breasts. °· There are three ways to pump. °¨ You can use your hand to massage and compress your breast. °¨ You can use a handheld manual pump. °¨ You can use an electric pump.   °· Make sure the suction cup (flange) on the breast pump is the right size. Place the flange directly over the nipple. If it is the wrong size or placed the wrong way, it may be painful and cause nipple damage.   °· If pumping is uncomfortable, apply a small amount of purified or modified lanolin to your nipple and areola. °· If you are using an electric pump, adjust the speed and suction power to be more comfortable. °· If pumping is painful or if you are not getting very much milk, you may need a different type of pump. A lactation consultant can help you determine what type of pump to use.   °· Keep a full water bottle near you at all times. Drinking lots of fluid helps you make more milk.  °· You can store your milk to use later. Pumped breast milk can be stored in a sealable, sterile container or plastic bag. Label all stored breast milk with the date you pumped it. °¨ Milk can stay out at room temperature for up to 8 hours. °¨   You can store your milk in the refrigerator for up to 8 days. °¨ You can store your milk in the freezer for 3 months. Thaw frozen milk using warm water. Do not put it in the microwave. °· Do not smoke. Smoking can lower your milk supply and harm your infant. If you need help quitting, ask your health care provider to recommend a program.   °WHEN SHOULD I CALL MY HEALTH CARE PROVIDER OR A LACTATION CONSULTANT? °· You are having trouble pumping. °· You are concerned that you are not making enough milk. °· You have nipple pain, soreness, or redness. °· You want to use birth control. Birth control pills may lower your milk supply. Talk to your health care provider about your options. °Document Released: 08/24/2009 Document Revised: 03/11/2013 Document Reviewed:  12/27/2012 °Exit[employer]® Patient Information ©2015 [employer]. This information is not intended to replace advice given to you by your health care provider. Make sure you discuss any questions you have with your health care provider. ° °Nutrition for the New Mother  °A new mother needs good health and nutrition so she can have energy to take care of a new baby. Whether a mother breastfeeds or formula feeds the baby, it is important to have a well-balanced diet. Foods from all the food groups should be chosen to meet the new mother's energy needs and to give her the nutrients needed for repair and healing.  °A HEALTHY EATING PLAN °The My [employer] plan for Moms outlines what you should eat to help you and your baby stay healthy. The energy and amount of food you need depends on whether or not you are breastfeeding. If you are breastfeeding you will need more nutrients. If you choose not to breastfeed, your nutrition goal should be to return to a healthy weight. Limiting calories may be needed if you are not breastfeeding.  °HOME CARE INSTRUCTIONS  °· For a personal plan based on your unique needs, see your Registered Dietitian or visit www.mypyramid.gov. °· Eat a variety of foods. The plan below will help guide you. The following chart has a suggested daily meal plan from the [employer] for Moms. °· Eat a variety of fruits and vegetables. °· Eat more dark green and orange vegetables and cooked dried beans. °· Make half your grains whole grains. Choose whole instead of refined grains. °· Choose low-fat or lean meats and poultry. °· Choose low-fat or fat-free dairy products like milk, cheese, or yogurt. °Fruits °· Breastfeeding: 2 cups °· Non-Breastfeeding: 2 cups °· What Counts as a serving? °¨ 1 cup of fruit or juice. °¨ ½ cup dried fruit. °Vegetables °· Breastfeeding: 3 cups °· Non-Breastfeeding: 2 ½ cups °· What Counts as a serving? °¨ 1 cup raw or cooked vegetables. °¨ Juice or 2 cups raw leafy  vegetables. °Grains °· Breastfeeding: 8 oz °· Non-Breastfeeding: 6 oz °· What Counts as a serving? °¨ 1 slice bread. °¨ 1 oz ready-to-eat cereal. °¨ ½ cup cooked pasta, rice, or cereal. °Meat and Beans °· Breastfeeding: 6 ½ oz °· Non-Breastfeeding: 5 ½ oz °· What Counts as a serving? °¨ 1 oz lean meat, poultry, or fish °¨ ¼ cup cooked dry beans °¨ ½ oz nuts or 1 egg °¨ 1 tbs peanut butter °Milk °· Breastfeeding: 3 cups °· Non-Breastfeeding: 3 cups °· What Counts as a serving? °¨ 1 cup milk. °¨ 8 oz yogurt. °¨ 1 ½ oz cheese. °¨ 2 oz processed cheese. °TIPS FOR THE BREASTFEEDING MOM °· Rapid weight   loss is not suggested when you are breastfeeding. By simply breastfeeding, you will be able to lose the weight gained during your pregnancy. Your caregiver can keep track of your weight and tell you if your weight loss is appropriate. °· Be sure to drink fluids. You may notice that you are thirstier than usual. A suggestion is to drink a glass of water or other beverage whenever you breastfeed. °· Avoid alcohol as it can be passed into your breast milk. °· Limit caffeine drinks to no more than 2 to 3 cups per day. °· You may need to keep taking your prenatal vitamin while you are breastfeeding. Talk with your caregiver about taking a vitamin or supplement. °RETURING TO A HEALTHY WEIGHT °· The My [employer] Plan for Moms will help you return to a healthy weight. It will also provide the nutrients you need. °· You may need to limit "empty" calories. These include: °¨ High fat foods like fried foods, fatty meats, fast food, butter, and mayonnaise. °¨ High sugar foods like sodas, jelly, candy, and sweets. °· Be physically active. Include 30 minutes of exercise or more each day. Choose an activity you like such as walking, swimming, biking, or aerobics. Check with your caregiver before you start to exercise. °Document Released: 06/13/2007 Document Revised: 05/29/2011 Document Reviewed: 06/13/2007 °ExitCare® Patient Information  ©2015 [employer]. This information is not intended to replace advice given to you by your health care provider. Make sure you discuss any questions you have with your health care provider. °Postpartum Depression and Baby Blues °The postpartum period begins right after the birth of a baby. During this time, there is often a great amount of joy and excitement. It is also a time of many changes in the life of the parents. Regardless of how many times a mother gives birth, each child brings new challenges and dynamics to the family. It is not unusual to have feelings of excitement along with confusing shifts in moods, emotions, and thoughts. All mothers are at risk of developing postpartum depression or the "baby blues." These mood changes can occur right after giving birth, or they may occur many months after giving birth. The baby blues or postpartum depression can be mild or severe. Additionally, postpartum depression can go away rather quickly, or it can be a long-term condition.  °CAUSES °Raised hormone levels and the rapid drop in those levels are thought to be a main cause of postpartum depression and the baby blues. A number of hormones change during and after pregnancy. Estrogen and progesterone usually decrease right after the delivery of your baby. The levels of thyroid hormone and various cortisol steroids also rapidly drop. Other factors that play a role in these mood changes include major life events and genetics.  °RISK FACTORS °If you have any of the following risks for the baby blues or postpartum depression, know what symptoms to watch out for during the postpartum period. Risk factors that may increase the likelihood of getting the baby blues or postpartum depression include: °· Having a personal or family history of depression.   °· Having depression while being pregnant.   °· Having premenstrual mood issues or mood issues related to oral contraceptives. °· Having a lot of life stress.   °· Having  marital conflict.   °· Lacking a social support network.   °· Having a baby with special needs.   °· Having health problems, such as diabetes.   °SIGNS AND SYMPTOMS °Symptoms of baby blues include: °· Brief changes in mood, such as going   from extreme happiness to sadness. °· Decreased concentration.   °· Difficulty sleeping.   °· Crying spells, tearfulness.   °· Irritability.   °· Anxiety.   °Symptoms of postpartum depression typically begin within the first month after giving birth. These symptoms include: °· Difficulty sleeping or excessive sleepiness.   °· Marked weight loss.   °· Agitation.   °· Feelings of worthlessness.   °· Lack of interest in activity or food.   °Postpartum psychosis is a very serious condition and can be dangerous. Fortunately, it is rare. Displaying any of the following symptoms is cause for immediate medical attention. Symptoms of postpartum psychosis include:  °· Hallucinations and delusions.   °· Bizarre or disorganized behavior.   °· Confusion or disorientation.   °DIAGNOSIS  °A diagnosis is made by an evaluation of your symptoms. There are no medical or lab tests that lead to a diagnosis, but there are various questionnaires that a health care provider may use to identify those with the baby blues, postpartum depression, or psychosis. Often, a screening tool called the Edinburgh Postnatal Depression Scale is used to diagnose depression in the postpartum period.  °TREATMENT °The baby blues usually goes away on its own in 1-2 weeks. Social support is often all that is needed. You will be encouraged to get adequate sleep and rest. Occasionally, you may be given medicines to help you sleep.  °Postpartum depression requires treatment because it can last several months or longer if it is not treated. Treatment may include individual or group therapy, medicine, or both to address any social, physiological, and psychological factors that may play a role in the depression. Regular exercise, a  healthy diet, rest, and social support may also be strongly recommended.  °Postpartum psychosis is more serious and needs treatment right away. Hospitalization is often needed. °HOME CARE INSTRUCTIONS °· Get as much rest as you can. Nap when the baby sleeps.   °· Exercise regularly. Some women find yoga and walking to be beneficial.   °· Eat a balanced and nourishing diet.   °· Do little things that you enjoy. Have a cup of tea, take a bubble bath, read your favorite magazine, or listen to your favorite music. °· Avoid alcohol.   °· Ask for help with household chores, cooking, grocery shopping, or running errands as needed. Do not try to do everything.   °· Talk to people close to you about how you are feeling. Get support from your partner, family members, friends, or other new moms. °· Try to stay positive in how you think. Think about the things you are grateful for.   °· Do not spend a lot of time alone.   °· Only take over-the-counter or prescription medicine as directed by your health care provider. °· Keep all your postpartum appointments.   °· Let your health care provider know if you have any concerns.   °SEEK MEDICAL CARE IF: °You are having a reaction to or problems with your medicine. °SEEK IMMEDIATE MEDICAL CARE IF: °· You have suicidal feelings.   °· You think you may harm the baby or someone else. °MAKE SURE YOU: °· Understand these instructions. °· Will watch your condition. °· Will get help right away if you are not doing well or get worse. °Document Released: 12/09/2003 Document Revised: 03/11/2013 Document Reviewed: 12/16/2012 °Exit[employer]® Patient Information ©2015 [employer]. This information is not intended to replace advice given to you by your health care provider. Make sure you discuss any questions you have with your health care provider. °Breastfeeding and Mastitis °Mastitis is inflammation of the breast tissue. It can occur in women who   are breastfeeding. This can make breastfeeding  painful. Mastitis will sometimes go away on its own. Your health care provider will help determine if treatment is needed. °CAUSES °Mastitis is often associated with a blocked milk (lactiferous) duct. This can happen when too much milk builds up in the breast. Causes of excess milk in the breast can include: °· Poor latch-on. If your baby is not latched onto the breast properly, she or he may not empty your breast completely while breastfeeding. °· Allowing too much time to pass between feedings. °· Wearing a bra or other clothing that is too tight. This puts extra pressure on the lactiferous ducts so milk does not flow through them as it should. °Mastitis can also be caused by a bacterial infection. Bacteria may enter the breast tissue through cuts or openings in the skin. In women who are breastfeeding, this may occur because of cracked or irritated skin. Cracks in the skin are often caused when your baby does not latch on properly to the breast. °SIGNS AND SYMPTOMS °· Swelling, redness, tenderness, and pain in an area of the breast. °· Swelling of the glands under the arm on the same side. °· Fever may or may not accompany mastitis. °If an infection is allowed to progress, a collection of pus (abscess) may develop. °DIAGNOSIS  °Your health care provider can usually diagnose mastitis based on your symptoms and a physical exam. Tests may be done to help confirm the diagnosis. These may include: °· Removal of pus from the breast by applying pressure to the area. This pus can be examined in the lab to determine which bacteria are present. If an abscess has developed, the fluid in the abscess can be removed with a needle. This can also be used to confirm the diagnosis and determine the bacteria present. In most cases, pus will not be present. °· Blood tests to determine if your body is fighting a bacterial infection. °· Mammogram or ultrasound tests to rule out other problems or diseases. °TREATMENT  °Mastitis that  occurs with breastfeeding will sometimes go away on its own. Your health care provider may choose to wait 24 hours after first seeing you to decide whether a prescription medicine is needed. If your symptoms are worse after 24 hours, your health care provider will likely prescribe an antibiotic medicine to treat the mastitis. He or she will determine which bacteria are most likely causing the infection and will then select an appropriate antibiotic medicine. This is sometimes changed based on the results of tests performed to identify the bacteria, or if there is no response to the antibiotic medicine selected. Antibiotic medicines are usually given by mouth. You may also be given medicine for pain. °HOME CARE INSTRUCTIONS °· Only take over-the-counter or prescription medicines for pain, fever, or discomfort as directed by your health care provider. °· If your health care provider prescribed an antibiotic medicine, take the medicine as directed. Make sure you finish it even if you start to feel better. °· Do not wear a tight or underwire bra. Wear a soft, supportive bra. °· Increase your fluid intake, especially if you have a fever. °· Continue to empty the breast. Your health care provider can tell you whether this milk is safe for your infant or needs to be thrown out. You may be told to stop nursing until your health care provider thinks it is safe for your baby. Use a breast pump if you are advised to stop nursing. °· Keep your nipples   clean and dry. °· Empty the first breast completely before going to the other breast. If your baby is not emptying your breasts completely for some reason, use a breast pump to empty your breasts. °· If you go back to work, pump your breasts while at work to stay in time with your nursing schedule. °· Avoid allowing your breasts to become overly filled with milk (engorged). °SEEK MEDICAL CARE IF: °· You have pus-like discharge from the breast. °· Your symptoms do not improve with  the treatment prescribed by your health care provider within 2 days. °SEEK IMMEDIATE MEDICAL CARE IF: °· Your pain and swelling are getting worse. °· You have pain that is not controlled with medicine. °· You have a red line extending from the breast toward your armpit. °· You have a fever or persistent symptoms for more than 2-3 days. °· You have a fever and your symptoms suddenly get worse. °MAKE SURE YOU:  °· Understand these instructions. °· Will watch your condition. °· Will get help right away if you are not doing well or get worse. °Document Released: 07/01/2004 Document Revised: 03/11/2013 Document Reviewed: 10/10/2012 °[employer]® Patient Information ©2015 [employer]. This information is not intended to replace advice given to you by your health care provider. Make sure you discuss any questions you have with your health care provider. °Breastfeeding °Deciding to breastfeed is one of the best choices you can make for you and your baby. A change in hormones during pregnancy causes your breast tissue to grow and increases the number and size of your milk ducts. These hormones also allow proteins, sugars, and fats from your blood supply to make breast milk in your milk-producing glands. Hormones prevent breast milk from being released before your baby is born as well as prompt milk flow after birth. Once breastfeeding has begun, thoughts of your baby, as well as his or her sucking or crying, can stimulate the release of milk from your milk-producing glands.  °BENEFITS OF BREASTFEEDING °For Your Baby °· Your first milk (colostrum) helps your baby's digestive system function better.   °· There are antibodies in your milk that help your baby fight off infections.   °· Your baby has a lower incidence of asthma, allergies, and sudden infant death syndrome.   °· The nutrients in breast milk are better for your baby than infant formulas and are designed uniquely for your baby's needs.   °· Breast milk improves your  baby's brain development.   °· Your baby is less likely to develop other conditions, such as childhood obesity, asthma, or type 2 diabetes mellitus.   °For You  °· Breastfeeding helps to create a very special bond between you and your baby.   °· Breastfeeding is convenient. Breast milk is always available at the correct temperature and costs nothing.   °· Breastfeeding helps to burn calories and helps you lose the weight gained during pregnancy.   °· Breastfeeding makes your uterus contract to its prepregnancy size faster and slows bleeding (lochia) after you give birth.   °· Breastfeeding helps to lower your risk of developing type 2 diabetes mellitus, osteoporosis, and breast or ovarian cancer later in life. °SIGNS THAT YOUR BABY IS HUNGRY °Early Signs of Hunger  °· Increased alertness or activity. °· Stretching. °· Movement of the head from side to side. °· Movement of the head and opening of the mouth when the corner of the mouth or cheek is stroked (rooting). °· Increased sucking sounds, smacking lips, cooing, sighing, or squeaking. °· Hand-to-mouth movements. °· Increased sucking of   fingers or hands. °Late Signs of Hunger °· Fussing. °· Intermittent crying. °Extreme Signs of Hunger °Signs of extreme hunger will require calming and consoling before your baby will be able to breastfeed successfully. Do not wait for the following signs of extreme hunger to occur before you initiate breastfeeding:   °· Restlessness. °· A loud, strong cry. °·  Screaming. °BREASTFEEDING BASICS °Breastfeeding Initiation °· Find a comfortable place to sit or lie down, with your neck and back well supported. °· Place a pillow or rolled up blanket under your baby to bring him or her to the level of your breast (if you are seated). Nursing pillows are specially designed to help support your arms and your baby while you breastfeed. °· Make sure that your baby's abdomen is facing your abdomen.   °· Gently massage your breast. With your  fingertips, massage from your chest wall toward your nipple in a circular motion. This encourages milk flow. You may need to continue this action during the feeding if your milk flows slowly. °· Support your breast with 4 fingers underneath and your thumb above your nipple. Make sure your fingers are well away from your nipple and your baby's mouth.   °· Stroke your baby's lips gently with your finger or nipple.   °· When your baby's mouth is open wide enough, quickly bring your baby to your breast, placing your entire nipple and as much of the colored area around your nipple (areola) as possible into your baby's mouth.   °¨ More areola should be visible above your baby's upper lip than below the lower lip.   °¨ Your baby's tongue should be between his or her lower gum and your breast.   °· Ensure that your baby's mouth is correctly positioned around your nipple (latched). Your baby's lips should create a seal on your breast and be turned out (everted). °· It is common for your baby to suck about 2-3 minutes in order to start the flow of breast milk. °Latching °Teaching your baby how to latch on to your breast properly is very important. An improper latch can cause nipple pain and decreased milk supply for you and poor weight gain in your baby. Also, if your baby is not latched onto your nipple properly, he or she may swallow some air during feeding. This can make your baby fussy. Burping your baby when you switch breasts during the feeding can help to get rid of the air. However, teaching your baby to latch on properly is still the best way to prevent fussiness from swallowing air while breastfeeding. °Signs that your baby has successfully latched on to your nipple:    °· Silent tugging or silent sucking, without causing you pain.   °· Swallowing heard between every 3-4 sucks.   °·  Muscle movement above and in front of his or her ears while sucking.   °Signs that your baby has not successfully latched on to  nipple:  °· Sucking sounds or smacking sounds from your baby while breastfeeding. °· Nipple pain. °If you think your baby has not latched on correctly, slip your finger into the corner of your baby's mouth to break the suction and place it between your baby's gums. Attempt breastfeeding initiation again. °Signs of Successful Breastfeeding °Signs from your baby:   °· A gradual decrease in the number of sucks or complete cessation of sucking.   °· Falling asleep.   °· Relaxation of his or her body.   °· Retention of a small amount of milk in his or her mouth.   °· Letting go   of your breast by himself or herself. °Signs from you: °· Breasts that have increased in firmness, weight, and size 1-3 hours after feeding.   °· Breasts that are softer immediately after breastfeeding. °· Increased milk volume, as well as a change in milk consistency and color by the fifth day of breastfeeding.   °· Nipples that are not sore, cracked, or bleeding. °Signs That Your Baby is Getting Enough Milk °· Wetting at least 3 diapers in a 24-hour period. The urine should be clear and pale yellow by age 5 days. °· At least 3 stools in a 24-hour period by age 5 days. The stool should be soft and yellow. °· At least 3 stools in a 24-hour period by age 7 days. The stool should be seedy and yellow. °· No loss of weight greater than 10% of birth weight during the first 3 days of age. °· Average weight gain of 4-7 ounces (113-198 g) per week after age 4 days. °· Consistent daily weight gain by age 5 days, without weight loss after the age of 2 weeks. °After a feeding, your baby may spit up a small amount. This is common. °BREASTFEEDING FREQUENCY AND DURATION °Frequent feeding will help you make more milk and can prevent sore nipples and breast engorgement. Breastfeed when you feel the need to reduce the fullness of your breasts or when your baby shows signs of hunger. This is called "breastfeeding on demand." Avoid introducing a pacifier to your  baby while you are working to establish breastfeeding (the first 4-6 weeks after your baby is born). After this time you may choose to use a pacifier. Research has shown that pacifier use during the first year of a baby's life decreases the risk of sudden infant death syndrome (SIDS). °Allow your baby to feed on each breast as long as he or she wants. Breastfeed until your baby is finished feeding. When your baby unlatches or falls asleep while feeding from the first breast, offer the second breast. Because newborns are often sleepy in the first few weeks of life, you may need to awaken your baby to get him or her to feed. °Breastfeeding times will vary from baby to baby. However, the following rules can serve as a guide to help you ensure that your baby is properly fed: °· Newborns (babies 4 weeks of age or younger) may breastfeed every 1-3 hours. °· Newborns should not go longer than 3 hours during the day or 5 hours during the night without breastfeeding. °· You should breastfeed your baby a minimum of 8 times in a 24-hour period until you begin to introduce solid foods to your baby at around 6 months of age. °BREAST MILK PUMPING °Pumping and storing breast milk allows you to ensure that your baby is exclusively fed your breast milk, even at times when you are unable to breastfeed. This is especially important if you are going back to work while you are still breastfeeding or when you are not able to be present during feedings. Your lactation consultant can give you guidelines on how long it is safe to store breast milk.  °A breast pump is a machine that allows you to pump milk from your breast into a sterile bottle. The pumped breast milk can then be stored in a refrigerator or freezer. Some breast pumps are operated by hand, while others use electricity. Ask your lactation consultant which type will work best for you. Breast pumps can be purchased, but some hospitals and breastfeeding support groups   lease  breast pumps on a monthly basis. A lactation consultant can teach you how to hand express breast milk, if you prefer not to use a pump.  °CARING FOR YOUR BREASTS WHILE YOU BREASTFEED °Nipples can become dry, cracked, and sore while breastfeeding. The following recommendations can help keep your breasts moisturized and healthy: °· Avoid using soap on your nipples.   °· Wear a supportive bra. Although not required, special nursing bras and tank tops are designed to allow access to your breasts for breastfeeding without taking off your entire bra or top. Avoid wearing underwire-style bras or extremely tight bras. °· Air dry your nipples for 3-4 minutes after each feeding.   °· Use only cotton bra pads to absorb leaked breast milk. Leaking of breast milk between feedings is normal.   °· Use lanolin on your nipples after breastfeeding. Lanolin helps to maintain your skin's normal moisture barrier. If you use pure lanolin, you do not need to wash it off before feeding your baby again. Pure lanolin is not toxic to your baby. You may also hand express a few drops of breast milk and gently massage that milk into your nipples and allow the milk to air dry. °In the first few weeks after giving birth, some women experience extremely full breasts (engorgement). Engorgement can make your breasts feel heavy, warm, and tender to the touch. Engorgement peaks within 3-5 days after you give birth. The following recommendations can help ease engorgement: °· Completely empty your breasts while breastfeeding or pumping. You may want to start by applying warm, moist heat (in the shower or with warm water-soaked hand towels) just before feeding or pumping. This increases circulation and helps the milk flow. If your baby does not completely empty your breasts while breastfeeding, pump any extra milk after he or she is finished. °· Wear a snug bra (nursing or regular) or tank top for 1-2 days to signal your body to slightly decrease milk  production. °· Apply ice packs to your breasts, unless this is too uncomfortable for you. °· Make sure that your baby is latched on and positioned properly while breastfeeding. °If engorgement persists after 48 hours of following these recommendations, contact your health care provider or a lactation consultant. °OVERALL HEALTH CARE RECOMMENDATIONS WHILE BREASTFEEDING °· Eat healthy foods. Alternate between meals and snacks, eating 3 of each per day. Because what you eat affects your breast milk, some of the foods may make your baby more irritable than usual. Avoid eating these foods if you are sure that they are negatively affecting your baby. °· Drink milk, fruit juice, and water to satisfy your thirst (about 10 glasses a day).   °· Rest often, relax, and continue to take your prenatal vitamins to prevent fatigue, stress, and anemia. °· Continue breast self-awareness checks. °· Avoid chewing and smoking tobacco. °· Avoid alcohol and drug use. °Some medicines that may be harmful to your baby can pass through breast milk. It is important to ask your health care provider before taking any medicine, including all over-the-counter and prescription medicine as well as vitamin and herbal supplements. °It is possible to become pregnant while breastfeeding. If birth control is desired, ask your health care provider about options that will be safe for your baby. °SEEK MEDICAL CARE IF:  °· You feel like you want to stop breastfeeding or have become frustrated with breastfeeding. °· You have painful breasts or nipples. °· Your nipples are cracked or bleeding. °· Your breasts are red, tender, or warm. °· You have   a swollen area on either breast. °· You have a fever or chills. °· You have nausea or vomiting. °· You have drainage other than breast milk from your nipples. °· Your breasts do not become full before feedings by the fifth day after you give birth. °· You feel sad and depressed. °· Your baby is too sleepy to eat  well. °· Your baby is having trouble sleeping.   °· Your baby is wetting less than 3 diapers in a 24-hour period. °· Your baby has less than 3 stools in a 24-hour period. °· Your baby's skin or the white part of his or her eyes becomes yellow.   °· Your baby is not gaining weight by 5 days of age. °SEEK IMMEDIATE MEDICAL CARE IF:  °· Your baby is overly tired (lethargic) and does not want to wake up and feed. °· Your baby develops an unexplained fever. °Document Released: 03/06/2005 Document Revised: 03/11/2013 Document Reviewed: 08/28/2012 °[employer]® Patient Information ©2015 [employer]. This information is not intended to replace advice given to you by your health care provider. Make sure you discuss any questions you have with your health care provider. ° °

## 2013-12-21 NOTE — Discharge Summary (Signed)
Reviewed and agree with note and plan. V.Leelynn Whetsel, MD  

## 2014-01-19 ENCOUNTER — Encounter (HOSPITAL_COMMUNITY): Payer: Self-pay | Admitting: Obstetrics & Gynecology

## 2015-07-15 ENCOUNTER — Ambulatory Visit: Payer: Self-pay | Admitting: Surgery

## 2015-07-21 ENCOUNTER — Encounter (HOSPITAL_BASED_OUTPATIENT_CLINIC_OR_DEPARTMENT_OTHER): Payer: Self-pay | Admitting: *Deleted

## 2015-07-27 ENCOUNTER — Encounter (HOSPITAL_BASED_OUTPATIENT_CLINIC_OR_DEPARTMENT_OTHER): Payer: Self-pay | Admitting: Anesthesiology

## 2015-07-27 ENCOUNTER — Ambulatory Visit (HOSPITAL_BASED_OUTPATIENT_CLINIC_OR_DEPARTMENT_OTHER)
Admission: RE | Admit: 2015-07-27 | Discharge: 2015-07-27 | Disposition: A | Discharge status: Home / Self Care | Payer: Managed Care, Other (non HMO) | Source: Ambulatory Visit | Attending: Surgery | Admitting: Surgery

## 2015-07-27 ENCOUNTER — Encounter (HOSPITAL_BASED_OUTPATIENT_CLINIC_OR_DEPARTMENT_OTHER)
Admission: RE | Disposition: A | Discharge status: Home / Self Care | Payer: Self-pay | Source: Ambulatory Visit | Attending: Surgery

## 2015-07-27 ENCOUNTER — Ambulatory Visit (HOSPITAL_BASED_OUTPATIENT_CLINIC_OR_DEPARTMENT_OTHER): Payer: Managed Care, Other (non HMO) | Admitting: Anesthesiology

## 2015-07-27 DIAGNOSIS — Z87891 Personal history of nicotine dependence: Secondary | ICD-10-CM | POA: Insufficient documentation

## 2015-07-27 DIAGNOSIS — Z79899 Other long term (current) drug therapy: Secondary | ICD-10-CM | POA: Insufficient documentation

## 2015-07-27 DIAGNOSIS — K429 Umbilical hernia without obstruction or gangrene: Secondary | ICD-10-CM | POA: Insufficient documentation

## 2015-07-27 HISTORY — PX: UMBILICAL HERNIA REPAIR: SHX196

## 2015-07-27 SURGERY — REPAIR, HERNIA, UMBILICAL, ADULT
Anesthesia: General | Site: Abdomen

## 2015-07-27 MED ORDER — SUGAMMADEX SODIUM 200 MG/2ML IV SOLN
INTRAVENOUS | Status: DC | PRN
  Administered 2015-07-27: 200 mg via INTRAVENOUS

## 2015-07-27 MED ORDER — MIDAZOLAM HCL 2 MG/2ML IJ SOLN: 1.0000 mg | INTRAMUSCULAR | Status: DC | PRN

## 2015-07-27 MED ORDER — DEXAMETHASONE SODIUM PHOSPHATE 4 MG/ML IJ SOLN
INTRAMUSCULAR | Status: DC | PRN
  Administered 2015-07-27: 10 mg via INTRAVENOUS

## 2015-07-27 MED ORDER — FENTANYL CITRATE (PF) 100 MCG/2ML IJ SOLN
INTRAMUSCULAR | Status: DC | PRN
  Administered 2015-07-27: 100 ug via INTRAVENOUS

## 2015-07-27 MED ORDER — SUCCINYLCHOLINE CHLORIDE 200 MG/10ML IV SOSY
PREFILLED_SYRINGE | INTRAVENOUS | Status: AC
  Filled 2015-07-27: qty 10

## 2015-07-27 MED ORDER — LIDOCAINE 2% (20 MG/ML) 5 ML SYRINGE
INTRAMUSCULAR | Status: AC
Start: 2015-07-27 — End: 2015-07-27
  Filled 2015-07-27: qty 5

## 2015-07-27 MED ORDER — FENTANYL CITRATE (PF) 100 MCG/2ML IJ SOLN
INTRAMUSCULAR | Status: AC
  Filled 2015-07-27: qty 2

## 2015-07-27 MED ORDER — MIDAZOLAM HCL 5 MG/5ML IJ SOLN
INTRAMUSCULAR | Status: DC | PRN
  Administered 2015-07-27: 2 mg via INTRAVENOUS

## 2015-07-27 MED ORDER — SCOPOLAMINE 1 MG/3DAYS TD PT72: 1.0000 | MEDICATED_PATCH | Freq: Once | TRANSDERMAL | Status: DC | PRN

## 2015-07-27 MED ORDER — FENTANYL CITRATE (PF) 100 MCG/2ML IJ SOLN
25.0000 ug | INTRAMUSCULAR | Status: DC | PRN
  Administered 2015-07-27 (×2): 25 ug via INTRAVENOUS

## 2015-07-27 MED ORDER — EPHEDRINE 5 MG/ML INJ
INTRAVENOUS | Status: AC
Start: 2015-07-27 — End: 2015-07-27
  Filled 2015-07-27: qty 10

## 2015-07-27 MED ORDER — PROPOFOL 10 MG/ML IV BOLUS
INTRAVENOUS | Status: DC | PRN
  Administered 2015-07-27: 150 mg via INTRAVENOUS

## 2015-07-27 MED ORDER — MEPERIDINE HCL 25 MG/ML IJ SOLN: 6.2500 mg | INTRAMUSCULAR | Status: DC | PRN

## 2015-07-27 MED ORDER — ROCURONIUM BROMIDE 50 MG/5ML IV SOLN
INTRAVENOUS | Status: AC
  Filled 2015-07-27: qty 1

## 2015-07-27 MED ORDER — FENTANYL CITRATE (PF) 100 MCG/2ML IJ SOLN
50.0000 ug | INTRAMUSCULAR | Status: DC | PRN
Start: 2015-07-27 — End: 2015-07-27

## 2015-07-27 MED ORDER — PROPOFOL 10 MG/ML IV BOLUS
INTRAVENOUS | Status: AC
  Filled 2015-07-27: qty 40

## 2015-07-27 MED ORDER — BUPIVACAINE-EPINEPHRINE 0.25% -1:200000 IJ SOLN
INTRAMUSCULAR | Status: DC | PRN
  Administered 2015-07-27: 7.5 mL

## 2015-07-27 MED ORDER — LIDOCAINE HCL (CARDIAC) 20 MG/ML IV SOLN
INTRAVENOUS | Status: DC | PRN
  Administered 2015-07-27: 50 mg via INTRAVENOUS

## 2015-07-27 MED ORDER — ONDANSETRON HCL 4 MG/2ML IJ SOLN
INTRAMUSCULAR | Status: DC | PRN
  Administered 2015-07-27: 4 mg via INTRAVENOUS

## 2015-07-27 MED ORDER — ROCURONIUM BROMIDE 100 MG/10ML IV SOLN
INTRAVENOUS | Status: DC | PRN
  Administered 2015-07-27: 15 mg via INTRAVENOUS

## 2015-07-27 MED ORDER — DEXTROSE 5 % IV SOLN
3.0000 g | INTRAVENOUS | Status: AC
  Administered 2015-07-27: 2 g via INTRAVENOUS

## 2015-07-27 MED ORDER — METOCLOPRAMIDE HCL 5 MG/ML IJ SOLN: 10.0000 mg | Freq: Once | INTRAMUSCULAR | Status: DC | PRN

## 2015-07-27 MED ORDER — LACTATED RINGERS IV SOLN
INTRAVENOUS | Status: DC
  Administered 2015-07-27 (×2): via INTRAVENOUS

## 2015-07-27 MED ORDER — OXYCODONE HCL 5 MG PO TABS
5.0000 mg | ORAL_TABLET | Freq: Once | ORAL | Status: AC | PRN
  Administered 2015-07-27: 5 mg via ORAL

## 2015-07-27 MED ORDER — DEXAMETHASONE SODIUM PHOSPHATE 10 MG/ML IJ SOLN
INTRAMUSCULAR | Status: AC
  Filled 2015-07-27: qty 1

## 2015-07-27 MED ORDER — GLYCOPYRROLATE 0.2 MG/ML IV SOSY
PREFILLED_SYRINGE | INTRAVENOUS | Status: AC
  Filled 2015-07-27: qty 3

## 2015-07-27 MED ORDER — ONDANSETRON HCL 4 MG/2ML IJ SOLN
INTRAMUSCULAR | Status: AC
  Filled 2015-07-27: qty 2

## 2015-07-27 MED ORDER — CEFAZOLIN SODIUM-DEXTROSE 2-4 GM/100ML-% IV SOLN
INTRAVENOUS | Status: AC
  Filled 2015-07-27: qty 100

## 2015-07-27 MED ORDER — ATROPINE SULFATE 0.4 MG/ML IV SOSY
PREFILLED_SYRINGE | INTRAVENOUS | Status: AC
  Filled 2015-07-27: qty 2.5

## 2015-07-27 MED ORDER — GLYCOPYRROLATE 0.2 MG/ML IJ SOLN: 0.2000 mg | Freq: Once | INTRAMUSCULAR | Status: DC | PRN

## 2015-07-27 MED ORDER — OXYCODONE HCL 5 MG PO TABS
ORAL_TABLET | ORAL | Status: AC
  Filled 2015-07-27: qty 1

## 2015-07-27 MED ORDER — MIDAZOLAM HCL 2 MG/2ML IJ SOLN
INTRAMUSCULAR | Status: AC
  Filled 2015-07-27: qty 2

## 2015-07-27 MED ORDER — SUGAMMADEX SODIUM 200 MG/2ML IV SOLN
INTRAVENOUS | Status: AC
Start: 2015-07-27 — End: 2015-07-27
  Filled 2015-07-27: qty 2

## 2015-07-27 MED ORDER — HYDROCODONE-ACETAMINOPHEN 5-325 MG PO TABS: 1.0000 | ORAL_TABLET | Freq: Four times a day (QID) | ORAL | Status: DC | PRN

## 2015-07-27 MED ORDER — LACTATED RINGERS IV SOLN: INTRAVENOUS | Status: DC

## 2015-07-27 SURGICAL SUPPLY — 47 items
APL SKNCLS STERI-STRIP NONHPOA (GAUZE/BANDAGES/DRESSINGS)
BENZOIN TINCTURE PRP APPL 2/3 (GAUZE/BANDAGES/DRESSINGS) IMPLANT
BLADE CLIPPER SURG (BLADE) IMPLANT
BLADE SURG 10 STRL SS (BLADE) IMPLANT
BLADE SURG 15 STRL LF DISP TIS (BLADE) ×2 IMPLANT
BLADE SURG 15 STRL SS (BLADE) ×4
CANISTER SUCT 1200ML W/VALVE (MISCELLANEOUS) IMPLANT
CHLORAPREP W/TINT 26ML (MISCELLANEOUS) ×4 IMPLANT
CLOSURE WOUND 1/2 X4 (GAUZE/BANDAGES/DRESSINGS)
COVER BACK TABLE 60X90IN (DRAPES) ×4 IMPLANT
COVER MAYO STAND STRL (DRAPES) ×4 IMPLANT
DECANTER SPIKE VIAL GLASS SM (MISCELLANEOUS) IMPLANT
DRAPE LAPAROTOMY TRNSV 102X78 (DRAPE) ×4 IMPLANT
DRAPE UTILITY XL STRL (DRAPES) ×4 IMPLANT
DRSG TEGADERM 4X4.75 (GAUZE/BANDAGES/DRESSINGS) IMPLANT
ELECT COATED BLADE 2.86 ST (ELECTRODE) ×4 IMPLANT
ELECT REM PT RETURN 9FT ADLT (ELECTROSURGICAL) ×4
ELECTRODE REM PT RTRN 9FT ADLT (ELECTROSURGICAL) ×2 IMPLANT
GLOVE BIOGEL PI IND STRL 8 (GLOVE) ×2 IMPLANT
GLOVE BIOGEL PI INDICATOR 8 (GLOVE) ×2
GLOVE ECLIPSE 8.0 STRL XLNG CF (GLOVE) ×4 IMPLANT
GLOVE EXAM NITRILE MD LF STRL (GLOVE) ×4 IMPLANT
GLOVE SURG SS PI 7.0 STRL IVOR (GLOVE) ×4 IMPLANT
GOWN STRL REUS W/ TWL LRG LVL3 (GOWN DISPOSABLE) ×4 IMPLANT
GOWN STRL REUS W/TWL LRG LVL3 (GOWN DISPOSABLE) ×8
LIQUID BAND (GAUZE/BANDAGES/DRESSINGS) ×4 IMPLANT
NEEDLE HYPO 22GX1.5 SAFETY (NEEDLE) IMPLANT
NEEDLE HYPO 25X1 1.5 SAFETY (NEEDLE) ×4 IMPLANT
NS IRRIG 1000ML POUR BTL (IV SOLUTION) IMPLANT
PACK BASIN DAY SURGERY FS (CUSTOM PROCEDURE TRAY) ×4 IMPLANT
PENCIL BUTTON HOLSTER BLD 10FT (ELECTRODE) ×4 IMPLANT
SLEEVE SCD COMPRESS KNEE MED (MISCELLANEOUS) ×4 IMPLANT
STAPLER VISISTAT 35W (STAPLE) IMPLANT
STRIP CLOSURE SKIN 1/2X4 (GAUZE/BANDAGES/DRESSINGS) IMPLANT
SUT MON AB 4-0 PC3 18 (SUTURE) ×4 IMPLANT
SUT NOVA NAB DX-16 0-1 5-0 T12 (SUTURE) ×4 IMPLANT
SUT NOVA NAB GS-22 2 0 T19 (SUTURE) IMPLANT
SUT SILK 3 0 SH 30 (SUTURE) IMPLANT
SUT VIC AB 2-0 SH 27 (SUTURE) ×4
SUT VIC AB 2-0 SH 27XBRD (SUTURE) ×2 IMPLANT
SUT VICRYL 3-0 CR8 SH (SUTURE) ×4 IMPLANT
SYR CONTROL 10ML LL (SYRINGE) ×4 IMPLANT
TOWEL OR 17X24 6PK STRL BLUE (TOWEL DISPOSABLE) ×4 IMPLANT
TOWEL OR NON WOVEN STRL DISP B (DISPOSABLE) ×4 IMPLANT
TUBE CONNECTING 20'X1/4 (TUBING)
TUBE CONNECTING 20X1/4 (TUBING) IMPLANT
YANKAUER SUCT BULB TIP NO VENT (SUCTIONS) IMPLANT

## 2015-07-27 NOTE — H&P (Signed)
Stephanie Harrington is an 35 y.o. female.   Chief Complaint: umbilical hernia HPI: Pt presents for repair of small umbilical hernia.  Past Medical History  Diagnosis Date  . IBS (irritable bowel syndrome)   . Recurrent upper respiratory infection (URI)   . GERD (gastroesophageal reflux disease)     no meds  . Hemorrhoid   . H/O varicella   . Pneumonia     walking pneumonia in childhood  . Anemia     childhood  . Depression   . Postpartum care following cesarean delivery (9/30) 12/17/2013    Past Surgical History  Procedure Laterality Date  . Tonsillectomy    . Addenoidectomy    . Wisdom tooth extraction    . Dilation and evacuation  12/19/2010    Procedure: DILATATION AND EVACUATION (D&E);  Surgeon: Robley FriesVaishali R Mody;  Location: [facility];  Service: Gynecology;  Laterality: N/A;  . Cesarean section  11/18/2011    Procedure: CESAREAN SECTION;  Surgeon: Robley FriesVaishali R Mody, MD;  Location: [facility];  Service: Gynecology;  Laterality: N/A;  Primary cesarean section of baby girl at 2013  [facility] 9/9  . Colonoscopy    . Cesarean section N/A 12/17/2013    Procedure: Repeat CESAREAN SECTION;  Surgeon: Robley FriesVaishali R Mody, MD;  Location: [facility];  Service: Obstetrics;  Laterality: N/A;  EDD: 12/17/13    Family History  Problem Relation Age of Onset  . Heart disease Mother   . Heart attack Mother   . Hyperlipidemia Father   . Hypertension Father   . Cancer Father     prostate  . Cancer Maternal Grandmother     breast  . Heart disease Maternal Grandmother   . Coronary artery disease Maternal Grandmother    Social History:  reports that she quit smoking about 5 years ago. Her smoking use included Cigarettes. She has a 2.25 pack-year smoking history. She has never used smokeless tobacco. She reports that she drinks alcohol. She reports that she does not use illicit drugs.  Allergies: No Known Allergies  Medications Prior to Admission  Medication Sig Dispense Refill  . loratadine (CLARITIN) 10 MG tablet  Take 10 mg by mouth daily.    . naproxen sodium (ANAPROX) 220 MG tablet Take 220 mg by mouth 2 (two) times daily as needed. Headache    . diphenhydramine-acetaminophen (TYLENOL PM) 25-500 MG TABS Take 1 tablet by mouth at bedtime as needed.    Marland Kitchen. ibuprofen (ADVIL,MOTRIN) 600 MG tablet Take 1 tablet (600 mg total) by mouth every 6 (six) hours. 30 tablet 0  . oxyCODONE-acetaminophen (PERCOCET/ROXICET) 5-325 MG per tablet Take 1 tablet by mouth every 4 (four) hours as needed (for pain scale less than 7). 30 tablet 0  . Prenatal Vit-Fe Fumarate-FA (PRENATAL MULTIVITAMIN) TABS Take 1 tablet by mouth at bedtime.      No results found for this or any previous visit (from the past 48 hour(s)). No results found.  Review of Systems  Constitutional: Negative.   HENT: Negative.   Respiratory: Negative.   Cardiovascular: Negative.   Gastrointestinal: Negative.   Skin: Negative.     Blood pressure 131/89, pulse 53, temperature 97.8 F (36.6 C), temperature source Oral, resp. rate 20, height 5\' 4"  (1.626 m), weight 58.06 kg (128 lb), last menstrual period 07/12/2015, SpO2 100 %, not currently breastfeeding. Physical Exam  Cardiovascular: Normal rate.   Respiratory: Effort normal.  GI:  Small reducible umbilical hernia   Musculoskeletal: Normal range of motion.  Psychiatric: She  has a normal mood and affect. Her behavior is normal. Judgment and thought content normal.     Assessment/Plan Umbilical hernia Pt desires repair   The risk of hernia repair include bleeding,  Infection,   Recurrence of the hernia,  Mesh use, chronic pain,  Organ injury,  Bowel injury,  Bladder injury,   nerve injury with numbness around the incision,  Death,  and worsening of preexisting  medical problems.  The alternatives to surgery have been discussed as well..  Long term expectations of both operative and non operative treatments have been discussed.   The patient agrees to proceed.  Gaege Sangalang A., MD 07/27/2015,  2:35 PM

## 2015-07-27 NOTE — Interval H&P Note (Signed)
History and Physical Interval Note:  07/27/2015 2:36 PM  Griffin DakinMichelle D Capistran  has presented today for surgery, with the diagnosis of umbilical hernia  The various methods of treatment have been discussed with the patient and family. After consideration of risks, benefits and other options for treatment, the patient has consented to  Procedure(s):  REPAIR UMBILICAL HERNIA (N/A) INSERTION OF MESH (N/A) as a surgical intervention .  The patient's history has been reviewed, patient examined, no change in status, stable for surgery.  I have reviewed the patient's chart and labs.  Questions were answered to the patient's satisfaction.     Latanga Nedrow A.

## 2015-07-27 NOTE — Transfer of Care (Signed)
Immediate Anesthesia Transfer of Care Note  Patient: Stephanie DakinMichelle D Harrington  Procedure(s) Performed: Procedure(s):  REPAIR UMBILICAL HERNIA (N/A)  Patient Location: PACU  Anesthesia Type:General  Level of Consciousness: awake and patient cooperative  Airway & Oxygen Therapy: Patient Spontanous Breathing and Patient connected to face mask oxygen  Post-op Assessment: Report given to RN and Post -op Vital signs reviewed and stable  Post vital signs: Reviewed and stable  Last Vitals:  Filed Vitals:   07/27/15 1339  BP: 131/89  Pulse: 53  Temp: 36.6 C  Resp: 20    Last Pain: There were no vitals filed for this visit.       Complications: No apparent anesthesia complications

## 2015-07-27 NOTE — Anesthesia Postprocedure Evaluation (Signed)
Anesthesia Post Note  Patient: Stephanie Harrington  Procedure(s) Performed: Procedure(s) (LRB):  REPAIR UMBILICAL HERNIA (N/A)  Patient location during evaluation: PACU Anesthesia Type: General Level of consciousness: awake Pain management: pain level controlled Vital Signs Assessment: post-procedure vital signs reviewed and stable Respiratory status: spontaneous breathing Cardiovascular status: stable Anesthetic complications: no    Last Vitals:  Filed Vitals:   07/27/15 1630 07/27/15 1645  BP: 134/82 135/85  Pulse: 55 86  Temp:  36.8 C  Resp: 12 16    Last Pain:  Filed Vitals:   07/27/15 1650  PainSc: 2                  EDWARDS,Macaila Tahir      

## 2015-07-27 NOTE — Anesthesia Postprocedure Evaluation (Signed)
Anesthesia Post Note  Patient: Stephanie DakinMichelle D Harrington  Procedure(s) Performed: Procedure(s) (LRB):  REPAIR UMBILICAL HERNIA (N/A)  Patient location during evaluation: PACU Anesthesia Type: General Level of consciousness: awake Pain management: pain level controlled Vital Signs Assessment: post-procedure vital signs reviewed and stable Respiratory status: spontaneous breathing Cardiovascular status: stable Anesthetic complications: no    Last Vitals:  Filed Vitals:   07/27/15 1630 07/27/15 1645  BP: 134/82 135/85  Pulse: 55 86  Temp:  36.8 C  Resp: 12 16    Last Pain:  Filed Vitals:   07/27/15 1650  PainSc: 2                  EDWARDS,Kris Burd

## 2015-07-27 NOTE — Anesthesia Preprocedure Evaluation (Addendum)
Anesthesia Evaluation  Patient identified by MRN, date of birth, ID band Patient awake    Reviewed: Allergy & Precautions, NPO status , Patient's Chart, lab work & pertinent test results  Airway Mallampati: II  TM Distance: >3 FB Neck ROM: Full    Dental no notable dental hx. (+) Caps, Dental Advisory Given,    Pulmonary neg pulmonary ROS, former smoker,    Pulmonary exam normal breath sounds clear to auscultation       Cardiovascular negative cardio ROS Normal cardiovascular exam Rhythm:Regular Rate:Normal     Neuro/Psych negative neurological ROS  negative psych ROS   GI/Hepatic negative GI ROS, Neg liver ROS, neg GERD  ,  Endo/Other  negative endocrine ROS  Renal/GU negative Renal ROS  negative genitourinary   Musculoskeletal negative musculoskeletal ROS (+)   Abdominal   Peds negative pediatric ROS (+)  Hematology negative hematology ROS (+)   Anesthesia Other Findings   Reproductive/Obstetrics negative OB ROS                            Anesthesia Physical Anesthesia Plan  ASA: I  Anesthesia Plan: General   Post-op Pain Management:    Induction: Intravenous  Airway Management Planned: LMA and Oral ETT  Additional Equipment:   Intra-op Plan:   Post-operative Plan: Extubation in OR  Informed Consent: I have reviewed the patients History and Physical, chart, labs and discussed the procedure including the risks, benefits and alternatives for the proposed anesthesia with the patient or authorized representative who has indicated his/her understanding and acceptance.   Dental advisory given  Plan Discussed with: CRNA  Anesthesia Plan Comments:         Anesthesia Quick Evaluation

## 2015-07-27 NOTE — Op Note (Signed)
Nick D Woulfe Feb 10, 1981 [id number] 07/27/2015  Preoperative diagnosis: umbilical hernia  Postoperative diagnosis: same   Procedure: Umbilical Hernia Repair  Surgeon: Harriette Bouillonhomas Brnadon Eoff, MD, FACS  Anesthesia: General and local   Clinical History and Indications: The patient presents for repair of her umbilical hernia. The risk of hernia repair include bleeding,  Infection,   Recurrence of the hernia,  Mesh use, chronic pain,  Organ injury,  Bowel injury,  Bladder injury,   nerve injury with numbness around the incision,  Death,  and worsening of preexisting  medical problems.  The alternatives to surgery have been discussed as well..  Long term expectations of both operative and non operative treatments have been discussed.   The patient agrees to proceed.  Procedure: The patient was seen in the preoperative area and the plans for the procedure reviewed again. She  had no further questions. I marked the area of the umbilicus as the operative site. She wishes to prodeed.  The patient was taken to the operating room and after satisfactory general  anesthesia had been obtained the area was clipped as needed, prepped and draped. The timeout was performed.  I used some 0.25 % bupivicaine with epinephrine  anesthesia to help with postoperative pain management. This was infiltrated around the umbilical area and additional infiltrated as I worked.  A curvilinear incision was made on the inferior aspect of the umbilicus. The umbilical skin was elevated off of the hernia sac. The hernia sac was dissected free of the subcutaneous tissues.  The defect measured 5 mm in maximal diameter. There was a small piece of preperitoneal fat that I reduced.The defect was closed with 3 2 O novafil sutures.   Once the repair was complete the incision was closed by using some 3-0 Vicryl subcutaneous and 4-0 Monocryl subcuticular sutures.  The patient tolerated the procedure well. There were no operative complications.  There was minimal blood loss. All counts were correct. She was taken to the PACU in satisfactory condition.  Dortha Schwalbehomas A Rosi Secrist, MD, FACS 07/27/2015 3:44 PM

## 2015-07-27 NOTE — Discharge Instructions (Signed)
[facility] _______[facility] ° °UMBILICAL OR INGUINAL HERNIA REPAIR: POST OP INSTRUCTIONS ° °Always review your discharge instruction sheet given to you by the facility where your surgery was performed. °IF YOU HAVE DISABILITY OR FAMILY LEAVE FORMS, YOU MUST BRING THEM TO THE OFFICE FOR PROCESSING.   °DO NOT GIVE THEM TO YOUR DOCTOR. ° °1. A  prescription for pain medication may be given to you upon discharge.  Take your pain medication as prescribed, if needed.  If narcotic pain medicine is not needed, then you may take acetaminophen (Tylenol) or ibuprofen (Advil) as needed. °2. Take your usually prescribed medications unless otherwise directed. °3. If you need a refill on your pain medication, please contact your pharmacy.  They will contact our office to request authorization. Prescriptions will not be filled after 5 pm or on week-ends. °4. You should follow a light diet the first 24 hours after arrival home, such as soup and crackers, etc.  Be sure to include lots of fluids daily.  Resume your normal diet the day after surgery. °5. Most patients will experience some swelling and bruising around the umbilicus or in the groin and scrotum.  Ice packs and reclining will help.  Swelling and bruising can take several days to resolve.  °6. It is common to experience some constipation if taking pain medication after surgery.  Increasing fluid intake and taking a stool softener (such as Colace) will usually help or prevent this problem from occurring.  A mild laxative (Milk of Magnesia or Miralax) should be taken according to package directions if there are no bowel movements after 48 hours. °7. Unless discharge instructions indicate otherwise, you may remove your bandages 24-48 hours after surgery, and you may shower at that time.  You may have steri-strips (small skin tapes) in place directly over the incision.  These strips should be left on the skin for 7-10 days.  If your surgeon used skin glue on the  incision, you may shower in 24 hours.  The glue will flake off over the next 2-3 weeks.  Any sutures or staples will be removed at the office during your follow-up visit. °8. ACTIVITIES:  You may resume regular (light) daily activities beginning the next day--such as daily self-care, walking, climbing stairs--gradually increasing activities as tolerated.  You may have sexual intercourse when it is comfortable.  Refrain from any heavy lifting or straining until approved by your doctor. °a. You may drive when you are no longer taking prescription pain medication, you can comfortably wear a seatbelt, and you can safely maneuver your car and apply brakes. °b. RETURN TO WORK:  __________________________________________________________ °9. You should see your doctor in the office for a follow-up appointment approximately 2-3 weeks after your surgery.  Make sure that you call for this appointment within a day or two after you arrive home to insure a convenient appointment time. °10. OTHER INSTRUCTIONS:  __________________________________________________________________________________________________________________________________________________________________________________________  °WHEN TO CALL YOUR DOCTOR: °1. Fever over 101.0 °2. Inability to urinate °3. Nausea and/or vomiting °4. Extreme swelling or bruising °5. Continued bleeding from incision. °6. Increased pain, redness, or drainage from the incision ° °The clinic staff is available to answer your questions during regular business hours.  Please don’t hesitate to call and ask to speak to one of the nurses for clinical concerns.  If you have a medical emergency, go to the nearest emergency room or call 911.  A surgeon from [facility] is always on call at the hospital ° ° °  [address], [address], [place], [place]  [address] ? ° [address] [address], [place], [place]   [address] °[phone] ? [phone] ? FAX [phone] °Web site:  [email or web address] ° °Post Anesthesia Home Care Instructions ° °Activity: °Get plenty of rest for the remainder of the day. A responsible adult should stay with you for 24 hours following the procedure.  °For the next 24 hours, DO NOT: °-Drive a car °-Operate machinery °-Drink alcoholic beverages °-Take any medication unless instructed by your physician °-Make any legal decisions or sign important papers. ° °Meals: °Start with liquid foods such as gelatin or soup. Progress to regular foods as tolerated. Avoid greasy, spicy, heavy foods. If nausea and/or vomiting occur, drink only clear liquids until the nausea and/or vomiting subsides. Call your physician if vomiting continues. ° °Special Instructions/Symptoms: °Your throat may feel dry or sore from the anesthesia or the breathing tube placed in your throat during surgery. If this causes discomfort, gargle with warm salt water. The discomfort should disappear within 24 hours. ° °If you had a scopolamine patch placed behind your ear for the management of post- operative nausea and/or vomiting: ° °1. The medication in the patch is effective for 72 hours, after which it should be removed.  Wrap patch in a tissue and discard in the trash. Wash hands thoroughly with soap and water. °2. You may remove the patch earlier than 72 hours if you experience unpleasant side effects which may include dry mouth, dizziness or visual disturbances. °3. Avoid touching the patch. Wash your hands with soap and water after contact with the patch. °  ° °

## 2015-07-27 NOTE — Anesthesia Procedure Notes (Signed)
Procedure Name: Intubation Date/Time: 07/27/2015 3:05 PM Performed by: Genevieve NorlanderLINKA, Brittiney Dicostanzo L Pre-anesthesia Checklist: Patient identified, Emergency Drugs available, Suction available, Patient being monitored and Timeout performed Patient Re-evaluated:Patient Re-evaluated prior to inductionOxygen Delivery Method: Circle System Utilized Preoxygenation: Pre-oxygenation with 100% oxygen Intubation Type: IV induction Ventilation: Mask ventilation without difficulty Laryngoscope Size: Miller and 3 Grade View: Grade II Tube type: Oral Tube size: 7.0 mm Number of attempts: 1 Airway Equipment and Method: Stylet and Oral airway Placement Confirmation: ETT inserted through vocal cords under direct vision,  positive ETCO2 and breath sounds checked- equal and bilateral Secured at: 20 cm Tube secured with: Tape Dental Injury: Teeth and Oropharynx as per pre-operative assessment

## 2015-07-28 ENCOUNTER — Encounter (HOSPITAL_BASED_OUTPATIENT_CLINIC_OR_DEPARTMENT_OTHER): Payer: Self-pay | Admitting: Surgery

## 2015-07-28 NOTE — Anesthesia Postprocedure Evaluation (Signed)
Anesthesia Post Note  Patient: Stephanie DakinMichelle D Harrington  Procedure(s) Performed: Procedure(s) (LRB):  REPAIR UMBILICAL HERNIA (N/A)  Patient location during evaluation: PACU Anesthesia Type: General Level of consciousness: awake and alert Pain management: pain level controlled Vital Signs Assessment: post-procedure vital signs reviewed and stable Respiratory status: spontaneous breathing, nonlabored ventilation, respiratory function stable and patient connected to nasal cannula oxygen Cardiovascular status: blood pressure returned to baseline and stable Postop Assessment: no signs of nausea or vomiting Anesthetic complications: no    Last Vitals:  Filed Vitals:   07/27/15 1630 07/27/15 1645  BP: 134/82 135/85  Pulse: 55 86  Temp:  36.8 C  Resp: 12 16    Last Pain:  Filed Vitals:   07/28/15 1003  PainSc: 2                  Phillips Groutarignan, Elihue Ebert

## 2016-12-15 ENCOUNTER — Emergency Department (HOSPITAL_COMMUNITY)
Admission: EM | Admit: 2016-12-15 | Discharge: 2016-12-15 | Disposition: A | Discharge status: Home / Self Care | Payer: 59 | Attending: Emergency Medicine | Admitting: Emergency Medicine

## 2016-12-15 ENCOUNTER — Emergency Department (HOSPITAL_COMMUNITY): Payer: 59

## 2016-12-15 ENCOUNTER — Encounter (HOSPITAL_COMMUNITY): Payer: Self-pay

## 2016-12-15 DIAGNOSIS — Z79899 Other long term (current) drug therapy: Secondary | ICD-10-CM | POA: Diagnosis not present

## 2016-12-15 DIAGNOSIS — K209 Esophagitis, unspecified without bleeding: Secondary | ICD-10-CM

## 2016-12-15 DIAGNOSIS — Z87891 Personal history of nicotine dependence: Secondary | ICD-10-CM | POA: Diagnosis not present

## 2016-12-15 DIAGNOSIS — R079 Chest pain, unspecified: Secondary | ICD-10-CM | POA: Diagnosis present

## 2016-12-15 DIAGNOSIS — Z7982 Long term (current) use of aspirin: Secondary | ICD-10-CM | POA: Insufficient documentation

## 2016-12-15 DIAGNOSIS — I10 Essential (primary) hypertension: Secondary | ICD-10-CM

## 2016-12-15 LAB — BASIC METABOLIC PANEL
ANION GAP: 9 (ref 5–15)
BUN: 9 mg/dL (ref 6–20)
CALCIUM: 9.2 mg/dL (ref 8.9–10.3)
CO2: 24 mmol/L (ref 22–32)
CREATININE: 0.91 mg/dL (ref 0.44–1.00)
Chloride: 102 mmol/L (ref 101–111)
GLUCOSE: 85 mg/dL (ref 65–99)
Potassium: 3.2 mmol/L — ABNORMAL LOW (ref 3.5–5.1)
Sodium: 135 mmol/L (ref 135–145)

## 2016-12-15 LAB — CBC
HCT: 40.1 % (ref 36.0–46.0)
HEMOGLOBIN: 13.5 g/dL (ref 12.0–15.0)
MCH: 28.7 pg (ref 26.0–34.0)
MCHC: 33.7 g/dL (ref 30.0–36.0)
MCV: 85.3 fL (ref 78.0–100.0)
PLATELETS: 222 10*3/uL (ref 150–400)
RBC: 4.7 MIL/uL (ref 3.87–5.11)
RDW: 12.1 % (ref 11.5–15.5)
WBC: 8.1 10*3/uL (ref 4.0–10.5)

## 2016-12-15 LAB — D-DIMER, QUANTITATIVE: D-Dimer, Quant: <0.27 ug/mL-FEU (ref 0.00–0.50)

## 2016-12-15 LAB — I-STAT TROPONIN, ED: TROPONIN I, POC: 0 ng/mL (ref 0.00–0.08)

## 2016-12-15 MED ORDER — GI COCKTAIL ~~LOC~~
30.0000 mL | Freq: Once | ORAL | Status: AC
  Administered 2016-12-15: 30 mL via ORAL
  Filled 2016-12-15: qty 30

## 2016-12-15 MED ORDER — RANITIDINE HCL 150 MG PO TABS: 150.0000 mg | ORAL_TABLET | Freq: Two times a day (BID) | ORAL | 0 refills | Status: DC

## 2016-12-15 MED ORDER — SUCRALFATE 1 G PO TABS: 1.0000 g | ORAL_TABLET | Freq: Three times a day (TID) | ORAL | 0 refills | Status: DC

## 2016-12-15 NOTE — ED Provider Notes (Signed)
[facility]-EMERGENCY DEPT Provider Note   CSN: [id number] Arrival date & time: 12/15/16  1819     History   Chief Complaint Chief Complaint  Patient presents with  . Chest Pain    HPI Stephanie Harrington is a 36 y.o. female.  36 yo, G3P1011, female presents to the ED for evaluation of chest pain. Patient describes a substernal pressure-like chest pain, worsening x 3 days. Patient states that pain is aggravated with eating and drinking. She notes no improvement with Tums or Zantac. She has had some nausea; no vomiting. She notes SOB with worsening pain. No fevers, hemoptysis, leg swelling, lightheadedness, syncope or near syncope, bowel changes. She started IVF injections 8 days ago. She has also been on a 10 day course of doxycycline and aspirin. Hypertensive in triage. Denies a hx of hypertension.     Past Medical History:  Diagnosis Date  . Anemia    childhood  . Depression   . GERD (gastroesophageal reflux disease)    no meds  . H/O varicella   . Hemorrhoid   . IBS (irritable bowel syndrome)   . Pneumonia    walking pneumonia in childhood  . Postpartum care following cesarean delivery (9/30) 12/17/2013  . Recurrent upper respiratory infection (URI)     Patient Active Problem List   Diagnosis Date Noted  . Postpartum care following cesarean delivery (9/30) 12/17/2013  . Status post repeat low transverse cesarean section 12/17/2013    Past Surgical History:  Procedure Laterality Date  . addenoidectomy    . CESAREAN SECTION  11/18/2011   Procedure: CESAREAN SECTION;  Surgeon: Robley Fries, MD;  Location: [facility];  Service: Gynecology;  Laterality: N/A;  Primary cesarean section of baby girl at 2013  APGAR 9/9  . CESAREAN SECTION N/A 12/17/2013   Procedure: Repeat CESAREAN SECTION;  Surgeon: Robley Fries, MD;  Location: [facility];  Service: Obstetrics;  Laterality: N/A;  EDD: 12/17/13  . COLONOSCOPY    . DILATION AND EVACUATION  12/19/2010   Procedure: DILATATION AND  EVACUATION (D&E);  Surgeon: Robley Fries;  Location: [facility];  Service: Gynecology;  Laterality: N/A;  . TONSILLECTOMY    . UMBILICAL HERNIA REPAIR N/A 07/27/2015   Procedure:  REPAIR UMBILICAL HERNIA;  Surgeon: Harriette Bouillon, MD;  Location: [facility];  Service: General;  Laterality: N/A;  . WISDOM TOOTH EXTRACTION      OB History    Gravida Para Term Preterm AB Living   SAB TAB Ectopic Multiple Live Births   1       2       Home Medications    Prior to Admission medications   Medication Sig Start Date End Date Taking? Authorizing Provider  aspirin EC 81 MG tablet Take 81 mg by mouth daily.   Yes [provider]  Coenzyme Q10 (COQ10) 100 MG CAPS Take 1 capsule by mouth 2 (two) times daily.   Yes [provider]  doxycycline (VIBRA-TABS) 100 MG tablet Take 100 mg by mouth 2 (two) times daily.   Yes [provider]  Follitropin Alfa (GONAL-F) 1050 units SOLR Inject 225 Units as directed daily.    Yes [provider]  Leuprolide Acetate (LUPRON Carmine) Inject 20 Units into the skin 2 (two) times daily.    Yes [provider]  Menotropins (MENOPUR) 75 units SOLR Inject 75 Units into the skin daily.   Yes [provider]  Prenatal Vit-Fe Fumarate-FA (PRENATAL MULTIVITAMIN) TABS Take 1 tablet by mouth at bedtime.   Yes [provider]  ranitidine (ZANTAC) 150 MG tablet Take 1 tablet (150 mg total) by mouth 2 (two) times daily. 12/15/16   Antony Madura, PA-C  sucralfate (CARAFATE) 1 g tablet Take 1 tablet (1 g total) by mouth 4 (four) times daily -  with meals and at bedtime. 12/15/16   Antony Madura, PA-C    Family History Family History  Problem Relation Age of Onset  . Heart disease Mother   . Heart attack Mother   . Hyperlipidemia Father   . Hypertension Father   . Cancer Father        prostate  . Cancer Maternal Grandmother        breast  . Heart disease Maternal Grandmother   . Coronary  artery disease Maternal Grandmother     Social History Social History  Substance Use Topics  . Smoking status: Former Smoker    Packs/day: 0.25    Years: 9.00    Types: Cigarettes    Quit date: 12/18/2009  . Smokeless tobacco: Never Used  . Alcohol use Yes     Comment: occasional     Allergies   Patient has no known allergies.   Review of Systems Review of Systems Ten systems reviewed and are negative for acute change, except as noted in the HPI.    Physical Exam Updated Vital Signs BP (!) 127/95   Pulse (!) 49   Temp 98.3 F (36.8 C) (Oral)   Resp 15   Ht  (1.626 m)   Wt 59 kg (130 lb)   LMP 12/04/2016   SpO2 100%   BMI 22.31 kg/m   Physical Exam  Constitutional: She is oriented to person, place, and time. She appears well-developed and well-nourished. No distress.  Nontoxic and in NAD  HENT:  Head: Normocephalic and atraumatic.  Eyes: Conjunctivae and EOM are normal. No scleral icterus.  Neck: Normal range of motion.  Cardiovascular: Normal rate, regular rhythm and intact distal pulses.   Pulmonary/Chest: Effort normal. No respiratory distress. She has no wheezes. She has no rales.  Respirations even and unlabored  Abdominal: Soft. She exhibits no distension and no mass. There is no tenderness. There is no guarding.  Musculoskeletal: Normal range of motion. She exhibits no edema.  Neurological: She is alert and oriented to person, place, and time. She exhibits normal muscle tone. Coordination normal.  Skin: Skin is warm and dry. No rash noted. She is not diaphoretic. No erythema. No pallor.  Psychiatric: She has a normal mood and affect. Her behavior is normal.  Nursing note and vitals reviewed.    ED Treatments / Results  Labs (all labs ordered are listed, but only abnormal results are displayed) Labs Reviewed  BASIC METABOLIC PANEL - Abnormal; Notable for the following:       Result Value   Potassium 3.2 (*)    All other components within  normal limits  CBC  D-DIMER, QUANTITATIVE (NOT AT [facility])  I-STAT TROPONIN, ED    EKG  EKG Interpretation  Date/Time:  Friday December 15 2016 18:27:09 EDT Ventricular Rate:  70 PR Interval:  138 QRS Duration: 80 QT Interval:  418 QTC Calculation: 451 R Axis:   81 Text Interpretation:  Normal sinus rhythm Nonspecific ST and T wave abnormality Abnormal ECG No old tracing to compare Confirmed by Melene Plan [phone]) on 12/15/2016 8:45:07 PM       Radiology  Dg Chest 2 View  Result Date: 12/15/2016 CLINICAL DATA:  Chest pain EXAM: CHEST  2 VIEW COMPARISON:  None. FINDINGS: The heart size and mediastinal contours are within normal limits. Both lungs are clear. The visualized skeletal structures are unremarkable. IMPRESSION: No active cardiopulmonary disease. Electronically Signed   By: Deatra Robinson M.D.   On: 12/15/2016 18:56    Procedures Procedures (including critical care time)  Medications Ordered in ED Medications  gi cocktail (Maalox,Lidocaine,Donnatal) (30 mLs Oral Given 12/15/16 2151)     Initial Impression / Assessment and Plan / ED Course  I have reviewed the triage vital signs and the nursing notes.  Pertinent labs & imaging results that were available during my care of the patient were reviewed by me and considered in my medical decision making (see chart for details).     36 year old female presents to the emergency department for chest pain which is burning and pressure-like. It has been worsening over the last 3 days. Worse with eating and drinking. Patient had significant improvement in her pain following a GI cocktail. She recently completed a ten-day course of doxycycline and aspirin. I have a high suspicion that symptoms are secondary to esophagitis/gastritis from these medications.  Patient with a reassuring cardiac workup today. Troponin is negative. No signs of acute ischemia on EKG. Chest x-ray negative for acute cardiopulmonary abnormality. D-dimer is also  negative, effectively ruling out PE. Patient has a low pretest probability for pulmonary embolus.  Plan to continue with outpatient management with Zantac and Carafate. Patient advised to see her primary care doctor to ensure resolution of her symptoms and for follow-up regarding her hypertension while in the ED. Return precautions discussed and provided. Patient discharged in stable condition with no unaddressed concerns.   Final Clinical Impressions(s) / ED Diagnoses   Final diagnoses:  Esophagitis  Hypertension not at goal    New Prescriptions New Prescriptions   RANITIDINE (ZANTAC) 150 MG TABLET    Take 1 tablet (150 mg total) by mouth 2 (two) times daily.   SUCRALFATE (CARAFATE) 1 G TABLET    Take 1 tablet (1 g total) by mouth 4 (four) times daily -  with meals and at bedtime.     Antony Madura, PA-C 12/15/16 2256    Melene Plan, DO 12/16/16 [phone]

## 2016-12-15 NOTE — ED Triage Notes (Signed)
Per Pt, Pt is coming from home with complaints of mid-center chest pain that started five days ago. Pt reports that the pain has become unbearable. Reported taking OTC Medication with no relief. Currently, Pt is in the middle of IVF treatments to get pregnant.

## 2016-12-15 NOTE — ED Notes (Signed)
E-signature pad not working in room. Pt verbalized understanding of prescriptions, follow up care, and d/c instructions. Pt ambulated with spouse and is in no apparent distress.

## 2016-12-15 NOTE — ED Notes (Signed)
PA at bedside.

## 2016-12-18 ENCOUNTER — Encounter: Payer: Self-pay | Admitting: Internal Medicine

## 2016-12-29 ENCOUNTER — Encounter: Payer: Self-pay | Admitting: Internal Medicine

## 2016-12-29 ENCOUNTER — Ambulatory Visit (INDEPENDENT_AMBULATORY_CARE_PROVIDER_SITE_OTHER): Payer: BLUE CROSS/BLUE SHIELD | Admitting: Internal Medicine

## 2016-12-29 VITALS — BP 112/72 | HR 76 | Ht 64.0 in | Wt 128.2 lb

## 2016-12-29 DIAGNOSIS — K208 Other esophagitis without bleeding: Secondary | ICD-10-CM

## 2016-12-29 DIAGNOSIS — T462X5A Adverse effect of other antidysrhythmic drugs, initial encounter: Secondary | ICD-10-CM

## 2016-12-29 HISTORY — DX: Other esophagitis without bleeding: K20.80

## 2016-12-29 HISTORY — DX: Other esophagitis without bleeding: T46.2X5A

## 2016-12-29 NOTE — Patient Instructions (Signed)
   You should continue to improve - as we discussed you should take one OTX Zantac for about a month. When you do take pills - one at a time and drink water/other liquid and be upright.  If you have more problems just let me know - hope not!  I appreciate the opportunity to care for you. Iva Boop, MD, Clementeen Graham

## 2016-12-29 NOTE — Progress Notes (Signed)
Stephanie Harrington 36 y.o. 12/18/1980 [id number]  Assessment & Plan:   Encounter Diagnosis  Name Primary?  . Pill esophagitis due to doxycycline Yes   She should try to avoid doxycycline in the future. No further workup at this time though she has recurrent problems with pill esophagitis or other issues we could consider imaging or endoscopy. She she'll finish her Carafate prescription and use the Zantac for about a month as well. Call back as needed. If she absolutely needs doxycycline in the future if there is a liquid version would consider that or just be incredibly careful and follow the doxycycline with large amounts of water. It sounds like she may not need to take this again is part of her in vitro fertilization process.  I appreciate the opportunity to care for this patient. CC: Georgianne Fick, MD  Dr. 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 She was seen and evaluated in the emergency department on September 28 and they concluded that she had pill esophagitis and ulcer. She stopped the doxycycline and was treated with Carafate and now intermittent H2 blocker and is much better. Occasional heartburn symptoms persist. Severe pain is gone. She decided to see me to see if there is anything that needed to be done long-term with this. She had never had problems like this in the past. Remote history of a colonoscopy when she was young, 2002. No dysphagia otherwise. The doxycycline was taken as part of a procedure where she had something called the uterine scrape and perhaps it's a preventive agent. She has been told she may not need to take this again if this is done. No Known Allergies Current Meds    Medication Sig  . Prenatal Vit-Fe Fumarate-FA (PRENATAL MULTIVITAMIN) TABS Take 1 tablet by mouth at bedtime.  . ranitidine (ZANTAC) 150 MG tablet Take 1 tablet (150 mg total) by mouth 2 (two) times daily.  . sucralfate (CARAFATE) 1 g tablet Take 1 tablet (1 g total) by mouth 4 (four) times daily -  with meals and at bedtime.   Past Medical History:  Diagnosis Date  . Anemia    childhood  . Depression   . GERD (gastroesophageal reflux disease)    no meds  . H/O varicella   . Hemorrhoid   . IBS (irritable bowel syndrome)   . Pneumonia    walking pneumonia in childhood  . Postpartum care following cesarean delivery (9/30) 12/17/2013  . Recurrent upper respiratory infection (URI)    Past Surgical History:  Procedure Laterality Date  . addenoidectomy    . CESAREAN SECTION  11/18/2011   Procedure: CESAREAN SECTION;  Surgeon: Robley Fries, MD;  Location: [facility];  Service: Gynecology;  Laterality: N/A;  Primary cesarean section of baby girl at 2013  APGAR 9/9  . CESAREAN SECTION N/A 12/17/2013   Procedure: Repeat CESAREAN SECTION;  Surgeon: Robley Fries, MD;  Location: [facility];  Service: Obstetrics;  Laterality: N/A;  EDD: 12/17/13  . COLONOSCOPY    . DILATION AND EVACUATION  12/19/2010   Procedure: DILATATION AND EVACUATION (D&E);  Surgeon: Robley Fries;  Location: [facility];  Service: Gynecology;  Laterality: N/A;  . TONSILLECTOMY    . UMBILICAL HERNIA REPAIR N/A 07/27/2015   Procedure:  REPAIR UMBILICAL HERNIA;  Surgeon: Harriette Bouillon, MD;  Location: [facility];  Service: General;  Laterality: N/A;  . WISDOM TOOTH EXTRACTION     Social History   Social History  . Marital status: Married    Spouse name: Arlys John  . Number of children: 2  . Years of education: 16   Occupational History  . Preschool teacher [employer]   Social History Main Topics  . Smoking status: Former Smoker    Packs/day: 0.25    Years: 9.00    Types: Cigarettes    Quit date: 12/18/2009  .  Smokeless tobacco: Never Used  . Alcohol use Yes     Comment: occasional  . Drug use: No  . Sexual activity: Yes    Birth control/ protection: None    Social History Narrative   Married to [place], 2 children, preschool teacher [employer]   family history includes Cancer in her father and maternal grandmother; Coronary artery disease in her maternal grandmother; Heart attack in her mother; Heart disease in her maternal grandmother and mother; Hyperlipidemia in her father; Hypertension in her father.   Review of Systems As per history of present illness. All other review of systems are negative.  Objective:   Physical Exam BP 112/72   Pulse 76   Ht  (1.626 m)   Wt 128 lb 3.2 oz (58.2 kg)   LMP 12/04/2016 (Approximate)   SpO2 98%   BMI 22.01 kg/m  No acute distress Pleasant young white woman Eyes anicteric Lungs are clear Neck supple no mass or tenderness Heart sounds are normal Abdomen is soft and nontender without organomegaly or mass

## 2017-01-22 DIAGNOSIS — E288 Other ovarian dysfunction: Secondary | ICD-10-CM | POA: Diagnosis not present

## 2017-01-22 DIAGNOSIS — Z319 Encounter for procreative management, unspecified: Secondary | ICD-10-CM | POA: Diagnosis not present

## 2017-02-07 DIAGNOSIS — E288 Other ovarian dysfunction: Secondary | ICD-10-CM | POA: Diagnosis not present

## 2017-02-07 DIAGNOSIS — Z3183 Encounter for assisted reproductive fertility procedure cycle: Secondary | ICD-10-CM | POA: Diagnosis not present

## 2017-02-07 DIAGNOSIS — E2839 Other primary ovarian failure: Secondary | ICD-10-CM | POA: Diagnosis not present

## 2017-02-14 DIAGNOSIS — Z3183 Encounter for assisted reproductive fertility procedure cycle: Secondary | ICD-10-CM | POA: Diagnosis not present

## 2017-02-14 DIAGNOSIS — E2839 Other primary ovarian failure: Secondary | ICD-10-CM | POA: Diagnosis not present

## 2017-02-14 DIAGNOSIS — E288 Other ovarian dysfunction: Secondary | ICD-10-CM | POA: Diagnosis not present

## 2017-02-19 DIAGNOSIS — E2839 Other primary ovarian failure: Secondary | ICD-10-CM | POA: Diagnosis not present

## 2017-02-19 DIAGNOSIS — E288 Other ovarian dysfunction: Secondary | ICD-10-CM | POA: Diagnosis not present

## 2017-02-19 DIAGNOSIS — Z3183 Encounter for assisted reproductive fertility procedure cycle: Secondary | ICD-10-CM | POA: Diagnosis not present

## 2017-02-22 DIAGNOSIS — Z3183 Encounter for assisted reproductive fertility procedure cycle: Secondary | ICD-10-CM | POA: Diagnosis not present

## 2017-02-22 DIAGNOSIS — E288 Other ovarian dysfunction: Secondary | ICD-10-CM | POA: Diagnosis not present

## 2017-02-22 DIAGNOSIS — E2839 Other primary ovarian failure: Secondary | ICD-10-CM | POA: Diagnosis not present

## 2017-02-24 DIAGNOSIS — Z3183 Encounter for assisted reproductive fertility procedure cycle: Secondary | ICD-10-CM | POA: Diagnosis not present

## 2017-02-24 DIAGNOSIS — E2839 Other primary ovarian failure: Secondary | ICD-10-CM | POA: Diagnosis not present

## 2017-02-24 DIAGNOSIS — E288 Other ovarian dysfunction: Secondary | ICD-10-CM | POA: Diagnosis not present

## 2017-03-09 DIAGNOSIS — Z3183 Encounter for assisted reproductive fertility procedure cycle: Secondary | ICD-10-CM | POA: Diagnosis not present

## 2017-03-09 DIAGNOSIS — E2839 Other primary ovarian failure: Secondary | ICD-10-CM | POA: Diagnosis not present

## 2017-03-09 DIAGNOSIS — E288 Other ovarian dysfunction: Secondary | ICD-10-CM | POA: Diagnosis not present

## 2017-04-09 DIAGNOSIS — F4322 Adjustment disorder with anxiety: Secondary | ICD-10-CM | POA: Diagnosis not present

## 2017-04-30 DIAGNOSIS — Z0000 Encounter for general adult medical examination without abnormal findings: Secondary | ICD-10-CM | POA: Diagnosis not present

## 2017-04-30 DIAGNOSIS — Z131 Encounter for screening for diabetes mellitus: Secondary | ICD-10-CM | POA: Diagnosis not present

## 2017-04-30 DIAGNOSIS — F4320 Adjustment disorder, unspecified: Secondary | ICD-10-CM | POA: Diagnosis not present

## 2017-05-03 ENCOUNTER — Encounter: Payer: Self-pay | Admitting: Obstetrics & Gynecology

## 2017-05-03 ENCOUNTER — Ambulatory Visit: Payer: BLUE CROSS/BLUE SHIELD | Admitting: Obstetrics & Gynecology

## 2017-05-03 VITALS — BP 130/88 | Ht 64.0 in | Wt 133.0 lb

## 2017-05-03 DIAGNOSIS — N979 Female infertility, unspecified: Secondary | ICD-10-CM

## 2017-05-03 DIAGNOSIS — Z3200 Encounter for pregnancy test, result unknown: Secondary | ICD-10-CM

## 2017-05-03 DIAGNOSIS — Z01419 Encounter for gynecological examination (general) (routine) without abnormal findings: Secondary | ICD-10-CM

## 2017-05-03 NOTE — Progress Notes (Addendum)
Stephanie Harrington Feb 17, 1981 [id number]   History:    37 y.o. G3P2A1 married.  2 daughters 3 1/2 and 29 1/2 yo.  Works at [employer].  RP:  New patient presenting for annual gyn exam   HPI: Wants to have a 3rd child.  2ndary infertility with very low AMH.  Was Dr Camillia Herter patient. Poor response to Hyperstimulation for IVF with Dr Jamse Arn.  On PNVs.  LMP 04/12/2017.  Per patient, this cycle felt ovulatory, so she did the [facility] test and it was positive.  Had timed IC.  This morning, very faint pos HPT.  Tender breasts.  No pelvic pain.  No vaginal bleeding.  Urine/BMs wnl.  Very fit, enjoys running and [employer] camp.  Past medical history,surgical history, family history and social history were all reviewed and documented in the EPIC chart.  Gynecologic History Patient's last menstrual period was 04/12/2017. Contraception: none Last Pap: 2018. Results were: normal per patient (will obtain records) Last mammogram: Never. Bone Density: Never Colonoscopy: Normal at 37 yo, done for rectal bleeding.  Obstetric History OB History  Gravida Para Term Preterm AB Living  3 2 2   1 2   SAB TAB Ectopic Multiple Live Births  1       2    # Outcome Date GA Lbr Len/2nd Weight Sex Delivery Anes PTL Lv  3 Term 12/17/13 [redacted]w[redacted]d  7 lb 7.6 oz (3.39 kg) F CS-LTranv Spinal  LIV     Birth Comments: No problems at birth  2 Term 11/18/11 [redacted]w[redacted]d  7 lb 0.5 oz (3.189 kg) F CS-LTranv EPI  LIV  1 SAB 2012               ROS: A ROS was performed and pertinent positives and negatives are included in the history.  GENERAL: No fevers or chills. HEENT: No change in vision, no earache, sore throat or sinus congestion. NECK: No pain or stiffness. CARDIOVASCULAR: No chest pain or pressure. No palpitations. PULMONARY: No shortness of breath, cough or wheeze. GASTROINTESTINAL: No abdominal pain, nausea, vomiting or diarrhea, melena or bright red blood per rectum. GENITOURINARY: No urinary frequency, urgency, hesitancy or  dysuria. MUSCULOSKELETAL: No joint or muscle pain, no back pain, no recent trauma. DERMATOLOGIC: No rash, no itching, no lesions. ENDOCRINE: No polyuria, polydipsia, no heat or cold intolerance. No recent change in weight. HEMATOLOGICAL: No anemia or easy bruising or bleeding. NEUROLOGIC: No headache, seizures, numbness, tingling or weakness. PSYCHIATRIC: No depression, no loss of interest in normal activity or change in sleep pattern.     Exam:   BP 130/88   Ht 5\' 4"  (1.626 m)   Wt 133 lb (60.3 kg)   LMP 04/12/2017   BMI 22.83 kg/m   Body mass index is 22.83 kg/m.  General appearance : Well developed well nourished female. No acute distress HEENT: Eyes: no retinal hemorrhage or exudates,  Neck supple, trachea midline, no carotid bruits, no thyroidmegaly Lungs: Clear to auscultation, no rhonchi or wheezes, or rib retractions  Heart: Regular rate and rhythm, no murmurs or gallops Breast:Examined in sitting and supine position were symmetrical in appearance, no palpable masses or tenderness,  no skin retraction, no nipple inversion, no nipple discharge, no skin discoloration, no axillary or supraclavicular lymphadenopathy Abdomen: no palpable masses or tenderness, no rebound or guarding Extremities: no edema or skin discoloration or tenderness  Pelvic: Vulva: Normal             Vagina: No gross lesions or  discharge  Cervix: No gross lesions or discharge.  Pap reflex done.  Uterus  AV, normal size, shape and consistency, non-tender and mobile  Adnexa  Without masses or tenderness  Anus: Normal   Assessment/Plan:  37 y.o. female for annual exam   1. Encounter for routine gynecological examination with Papanicolaou smear of cervix Normal gynecologic exam.  Pap reflex done.  Breast exam normal.  2. Encounter for confirmation of pregnancy test result with physical examination Probable early pregnancy.  Will do a serum pregnancy test today and quant if positive.  If positive, will  repeat quant on February 18 to confirm a good rise.  Planning an OB ultrasound at 6 weeks or above per results. - hCG, serum, qualitative - B-HCG Quant Other orders - progesterone (PROMETRIUM) 200 MG capsule; Take 1 capsule (200 mg total) by mouth at bedtime.  3. Female infertility, secondary 37 year old with a previously very low AMH.  Refer to Dr. April MansonYalcinkaya and hyperstimulation was suboptimal.  No good egg for embryo transfer.  Counseling on above issues more than 50% for 20 minutes.  Genia DelMarie-Lyne Jasmane Brockway MD, 2:21 PM 05/03/2017

## 2017-05-04 ENCOUNTER — Telehealth: Payer: Self-pay | Admitting: *Deleted

## 2017-05-04 ENCOUNTER — Encounter: Payer: Self-pay | Admitting: Obstetrics & Gynecology

## 2017-05-04 ENCOUNTER — Other Ambulatory Visit: Payer: Self-pay | Admitting: Obstetrics & Gynecology

## 2017-05-04 DIAGNOSIS — Z3490 Encounter for supervision of normal pregnancy, unspecified, unspecified trimester: Secondary | ICD-10-CM

## 2017-05-04 LAB — HCG, QUANTITATIVE, PREGNANCY: HCG, Total, QN: 15 m[IU]/mL

## 2017-05-04 MED ORDER — PROGESTERONE MICRONIZED 200 MG PO CAPS: 200.0000 mg | ORAL_CAPSULE | Freq: Every day | ORAL | 0 refills | Status: DC

## 2017-05-04 NOTE — Patient Instructions (Signed)
1. Encounter for routine gynecological examination with Papanicolaou smear of cervix Normal gynecologic exam.  Pap reflex done.  Breast exam normal.  2. Encounter for confirmation of pregnancy test result with physical examination Probable early pregnancy.  Will do a serum pregnancy test today and quant if positive.  If positive, will repeat quant on February 18 to confirm a good rise.  Planning an OB ultrasound at 6 weeks or above per results. - hCG, serum, qualitative - B-HCG Quant  3. Female infertility, secondary 37 year old with a previously very low AMH.  Refer to Dr. April Manson and hyperstimulation was suboptimal.  No good egg for embryo transfer.  Stephanie Harrington, it was a pleasure meeting you today!  I will inform you of your results as soon as they are available.   Eating Plan for Pregnant Women While you are pregnant, your body will require additional nutrition to help support your growing baby. It is recommended that you consume:  150 additional calories each day during your first trimester.  300 additional calories each day during your second trimester.  300 additional calories each day during your third trimester.  Eating a healthy, well-balanced diet is very important for your health and for your baby's health. You also have a higher need for some vitamins and minerals, such as folic acid, calcium, iron, and vitamin D. What do I need to know about eating during pregnancy?  Do not try to lose weight or go on a diet during pregnancy.  Choose healthy, nutritious foods. Choose  of a sandwich with a glass of milk instead of a candy bar or a high-calorie sugar-sweetened beverage.  Limit your overall intake of foods that have "empty calories." These are foods that have little nutritional value, such as sweets, desserts, candies, sugar-sweetened beverages, and fried foods.  Eat a variety of foods, especially fruits and vegetables.  Take a prenatal vitamin to help meet the  additional needs during pregnancy, specifically for folic acid, iron, calcium, and vitamin D.  Remember to stay active. Ask your health care provider for exercise recommendations that are specific to you.  Practice good food safety and cleanliness, such as washing your hands before you eat and after you prepare raw meat. This helps to prevent foodborne illnesses, such as listeriosis, that can be very dangerous for your baby. Ask your health care provider for more information about listeriosis. What does 150 extra calories look like? Healthy options for an additional 150 calories each day could be any of the following:  Plain low-fat yogurt (6-8 oz) with  cup of berries.  1 apple with 2 teaspoons of peanut butter.  Cut-up vegetables with  cup of hummus.  Low-fat chocolate milk (8 oz or 1 cup).  1 string cheese with 1 medium orange.   of a peanut butter and jelly sandwich on whole-wheat bread (1 tsp of peanut butter).  For 300 calories, you could eat two of those healthy options each day. What is a healthy amount of weight to gain? The recommended amount of weight for you to gain is based on your pre-pregnancy BMI. If your pre-pregnancy BMI was:  Less than 18 (underweight), you should gain 28-40 lb.  18-24.9 (normal), you should gain 25-35 lb.  25-29.9 (overweight), you should gain 15-25 lb.  Greater than 30 (obese), you should gain 11-20 lb.  What if I am having twins or multiples? Generally, pregnant women who will be having twins or multiples may need to increase their daily calories by 300-600 calories each day.  The recommended range for total weight gain is 25-54 lb, depending on your pre-pregnancy BMI. Talk with your health care provider for specific guidance about additional nutritional needs, weight gain, and exercise during your pregnancy. What foods can I eat? Grains Any grains. Try to choose whole grains, such as whole-wheat bread, oatmeal, or brown  rice. Vegetables Any vegetables. Try to eat a variety of colors and types of vegetables to get a full range of vitamins and minerals. Remember to wash your vegetables well before eating. Fruits Any fruits. Try to eat a variety of colors and types of fruit to get a full range of vitamins and minerals. Remember to wash your fruits well before eating. Meats and Other Protein Sources Lean meats, including chicken, [place], fish, and lean cuts of beef, veal, or pork. Make sure that all meats are cooked to "well done." Tofu. Tempeh. Beans. Eggs. Peanut butter and other nut butters. Seafood, such as shrimp, crab, and lobster. If you choose fish, select types that are higher in omega-3 fatty acids, including salmon, herring, mussels, trout, sardines, and pollock. Make sure that all meats are cooked to food-safe temperatures. Dairy Pasteurized milk and milk alternatives. Pasteurized yogurt and pasteurized cheese. Cottage cheese. Sour cream. Beverages Water. Juices that contain 100% fruit juice or vegetable juice. Caffeine-free teas and decaffeinated coffee. Drinks that contain caffeine are okay to drink, but it is better to avoid caffeine. Keep your total caffeine intake to less than 200 mg each day (12 oz of coffee, tea, or soda) or as directed by your health care provider. Condiments Any pasteurized condiments. Sweets and Desserts Any sweets and desserts. Fats and Oils Any fats and oils. The items listed above may not be a complete list of recommended foods or beverages. Contact your dietitian for more options. What foods are not recommended? Vegetables Unpasteurized (raw) vegetable juices. Fruits Unpasteurized (raw) fruit juices. Meats and Other Protein Sources Cured meats that have nitrates, such as bacon, salami, and hotdogs. Luncheon meats, bologna, or other deli meats (unless they are reheated until they are steaming hot). Refrigerated pate, meat spreads from a meat counter, smoked seafood that  is found in the refrigerated section of a store. Raw fish, such as sushi or sashimi. High mercury content fish, such as tilefish, shark, swordfish, and king mackerel. Raw meats, such as tuna or beef tartare. Undercooked meats and poultry. Make sure that all meats are cooked to food-safe temperatures. Dairy Unpasteurized (raw) milk and any foods that have raw milk in them. Soft cheeses, such as feta, queso blanco, queso fresco, Brie, Camembert cheeses, blue-veined cheeses, and Panela cheese (unless it is made with pasteurized milk, which must be stated on the label). Beverages Alcohol. Sugar-sweetened beverages, such as sodas, teas, or energy drinks. Condiments Homemade fermented foods and drinks, such as pickles, sauerkraut, or kombucha drinks. (Store-bought pasteurized versions of these are okay.) Other Salads that are made in the store, such as ham salad, chicken salad, egg salad, tuna salad, and seafood salad. The items listed above may not be a complete list of foods and beverages to avoid. Contact your dietitian for more information. This information is not intended to replace advice given to you by your health care provider. Make sure you discuss any questions you have with your health care provider. Document Released: 12/19/2013 Document Revised: 08/12/2015 Document Reviewed: 08/19/2013 Elsevier Interactive Patient Education  [employer]2018 [employer] Inc[employer]

## 2017-05-04 NOTE — Telephone Encounter (Signed)
Yes sending it now, just wanted to see the Quant BHCG first.  Repeat Quant BHCG 2/18th.

## 2017-05-04 NOTE — Telephone Encounter (Signed)
I spoke with patient and let her know Rx sent. She will return on 18th for Quant as we scheduled her earlier today. However, [place] is headed [place] of town [place]. She asked if I could send it to [facility] there. I called and cancelled the local Rx and [place] to [place] pharmacy [place]

## 2017-05-04 NOTE — Telephone Encounter (Signed)
Pt called was informed with Quant results yesterday, states she thought maybe a progesterone Rx was going to be send to into pharmacy? Or did you want to wait until repeat Quant on 05/07/17? Pt said she took progesterone with her previous child. Please advise

## 2017-05-04 NOTE — Addendum Note (Signed)
Addended by: Genia DelLAVOIE, MARIE-LYNE on: 05/04/2017 03:59 PM   Modules accepted: Orders

## 2017-05-07 ENCOUNTER — Other Ambulatory Visit: Payer: BLUE CROSS/BLUE SHIELD

## 2017-05-07 DIAGNOSIS — Z3490 Encounter for supervision of normal pregnancy, unspecified, unspecified trimester: Secondary | ICD-10-CM

## 2017-05-07 DIAGNOSIS — Z0000 Encounter for general adult medical examination without abnormal findings: Secondary | ICD-10-CM | POA: Diagnosis not present

## 2017-05-07 LAB — PAP IG W/ RFLX HPV ASCU

## 2017-05-08 ENCOUNTER — Other Ambulatory Visit: Payer: Self-pay | Admitting: *Deleted

## 2017-05-08 DIAGNOSIS — O3680X0 Pregnancy with inconclusive fetal viability, not applicable or unspecified: Secondary | ICD-10-CM

## 2017-05-08 LAB — HCG, QUANTITATIVE, PREGNANCY: HCG, Total, QN: 153 m[IU]/mL

## 2017-05-23 ENCOUNTER — Telehealth: Payer: Self-pay | Admitting: *Deleted

## 2017-05-23 DIAGNOSIS — J399 Disease of upper respiratory tract, unspecified: Secondary | ICD-10-CM | POA: Diagnosis not present

## 2017-05-23 NOTE — Telephone Encounter (Signed)
Pt informed

## 2017-05-23 NOTE — Telephone Encounter (Signed)
May need Amoxyl, which would be fine in pregnancy.  Please evaluate/manage with Fam MD.

## 2017-05-23 NOTE — Telephone Encounter (Signed)
Pt early pregnant has symptoms of sinus infection, drainage, pressure etc. Asked what can she take for this? Please advise

## 2017-05-30 ENCOUNTER — Ambulatory Visit (INDEPENDENT_AMBULATORY_CARE_PROVIDER_SITE_OTHER): Payer: BLUE CROSS/BLUE SHIELD

## 2017-05-30 ENCOUNTER — Ambulatory Visit: Payer: BLUE CROSS/BLUE SHIELD | Admitting: Obstetrics & Gynecology

## 2017-05-30 DIAGNOSIS — O219 Vomiting of pregnancy, unspecified: Secondary | ICD-10-CM | POA: Diagnosis not present

## 2017-05-30 DIAGNOSIS — Z3A01 Less than 8 weeks gestation of pregnancy: Secondary | ICD-10-CM

## 2017-05-30 DIAGNOSIS — O3680X0 Pregnancy with inconclusive fetal viability, not applicable or unspecified: Secondary | ICD-10-CM

## 2017-05-30 DIAGNOSIS — Z3491 Encounter for supervision of normal pregnancy, unspecified, first trimester: Secondary | ICD-10-CM

## 2017-05-30 DIAGNOSIS — Z3201 Encounter for pregnancy test, result positive: Secondary | ICD-10-CM

## 2017-05-30 DIAGNOSIS — Z3689 Encounter for other specified antenatal screening: Secondary | ICD-10-CM

## 2017-05-30 MED ORDER — DOXYLAMINE-PYRIDOXINE 10-10 MG PO TBEC: 2.0000 | DELAYED_RELEASE_TABLET | Freq: Every day | ORAL | Status: DC

## 2017-06-01 NOTE — Progress Notes (Signed)
    Stephanie DakinMichelle D Harrington [birth year] [id number]        37 y.o.  [id number]  married.  2 daughters 3 1/2 and 895 1/36 yo.  Works at [employer].  RP:  Ob US for dating and viability  HPI:  LMP 04/12/2017.  No vaginal bleeding, no pelvic pain.  Nausea, rare vomiting.  On Prometrium for low AMH/secondary infertility.  PNVs tolerated.   OB History  Gravida Para Term Preterm AB Living  4 2 2   1 2   SAB TAB Ectopic Multiple Live Births  1       2    # Outcome Date GA Lbr Len/2nd Weight Sex Delivery Anes PTL Lv  4 Current           3 Term 12/17/13 6675w3d  7 lb 7.6 oz (3.39 kg) F CS-LTranv Spinal  LIV     Birth Comments: No problems at birth  2 Term 11/18/11 8536w3d  7 lb 0.5 oz (3.189 kg) F CS-LTranv EPI  LIV  1 SAB 2012              Past medical history,surgical history, problem list, medications, allergies, family history and social history were all reviewed and documented in the EPIC chart.   Directed ROS with pertinent positives and negatives documented in the history of present illness/assessment and plan.  Exam:  There were no vitals filed for this visit. General appearance:  Normal  Ob US today: T/V and T/A images.  Single intrauterine pregnancy at 6 weeks and 6 days per last menstrual period.  Expected date of delivery January 17, 2018.  Ultrasound measurement corresponding with a crown-rump length at 7 weeks and 1 day.  Fetal heart rate 150 bpm.  Uterus anteverted, long closed cervix.  Right ovary normal.  Left ovary with a corpus luteum cyst measuring 2.4 x 1.9 cm.  No free fluid in the posterior to the sac.   Assessment/Plan:  [age] y.o. [id number]   1. First trimester pregnancy First trimester single intrauterine pregnancy at 6 weeks and 6 days confirmed by ultrasound.  Fetal heart rate 150 bpm. Dating corresponds, expected date of delivery by last menstrual period is January 17, 2018.  We will continue Prometrium until 13 weeks.  Will schedule new OB visit with Dr. Juliene PinaMody.  First  trimester genetic screening briefly discussed.  Continue with prenatal vitamins.  2. Nausea and vomiting in pregnancy Vitamin B6 recommended.  Will not start on Diclegis unless vomiting worsens.  Prescription sent to pharmacy as she has needed it in previous pregnancies.  Other orders - Doxylamine-Pyridoxine 10-10 MG TBEC; Take 2 tablets by mouth at bedtime.  Counseling on above issues and coordination of care more than 50% for 15 minutes.  Stephanie DelMarie-Lyne Tangee Marszalek MD, 4:11 PM 06/01/2017

## 2017-06-04 ENCOUNTER — Encounter: Payer: Self-pay | Admitting: Obstetrics & Gynecology

## 2017-06-04 NOTE — Patient Instructions (Signed)
1. First trimester pregnancy First trimester single intrauterine pregnancy at 6 weeks and 6 days confirmed by ultrasound.  Fetal heart rate 150 bpm. Dating corresponds, expected date of delivery by last menstrual period is January 17, 2018.  We will continue Prometrium until 13 weeks.  Will schedule new OB visit with Stephanie Harrington.  First trimester genetic screening briefly discussed.  Continue with prenatal vitamins.  2. Nausea and vomiting in pregnancy Vitamin B6 recommended.  Will not start on Diclegis unless vomiting worsens.  Prescription sent to pharmacy as she has needed it in previous pregnancies.  Other orders - Doxylamine-Pyridoxine 10-10 MG TBEC; Take 2 tablets by mouth at bedtime.  Stephanie Harrington, all my congratulations and please update me on your pregnancy as you go and eventually on your delivery!   Eating Plan for Pregnant Women While you are pregnant, your body will require additional nutrition to help support your growing baby. It is recommended that you consume:  150 additional calories each day during your first trimester.  300 additional calories each day during your second trimester.  300 additional calories each day during your third trimester.  Eating a healthy, well-balanced diet is very important for your health and for your baby's health. You also have a higher need for some vitamins and minerals, such as folic acid, calcium, iron, and vitamin D. What do I need to know about eating during pregnancy?  Do not try to lose weight or go on a diet during pregnancy.  Choose healthy, nutritious foods. Choose  of a sandwich with a glass of milk instead of a candy bar or a high-calorie sugar-sweetened beverage.  Limit your overall intake of foods that have "empty calories." These are foods that have little nutritional value, such as sweets, desserts, candies, sugar-sweetened beverages, and fried foods.  Eat a variety of foods, especially fruits and vegetables.  Take a prenatal  vitamin to help meet the additional needs during pregnancy, specifically for folic acid, iron, calcium, and vitamin D.  Remember to stay active. Ask your health care provider for exercise recommendations that are specific to you.  Practice good food safety and cleanliness, such as washing your hands before you eat and after you prepare raw meat. This helps to prevent foodborne illnesses, such as listeriosis, that can be very dangerous for your baby. Ask your health care provider for more information about listeriosis. What does 150 extra calories look like? Healthy options for an additional 150 calories each day could be any of the following:  Plain low-fat yogurt (6-8 oz) with  cup of berries.  1 apple with 2 teaspoons of peanut butter.  Cut-up vegetables with  cup of hummus.  Low-fat chocolate milk (8 oz or 1 cup).  1 string cheese with 1 medium orange.   of a peanut butter and jelly sandwich on whole-wheat bread (1 tsp of peanut butter).  For 300 calories, you could eat two of those healthy options each day. What is a healthy amount of weight to gain? The recommended amount of weight for you to gain is based on your pre-pregnancy BMI. If your pre-pregnancy BMI was:  Less than 18 (underweight), you should gain 28-40 lb.  18-24.9 (normal), you should gain 25-35 lb.  25-29.9 (overweight), you should gain 15-25 lb.  Greater than 30 (obese), you should gain 11-20 lb.  What if I am having twins or multiples? Generally, pregnant women who will be having twins or multiples may need to increase their daily calories by 300-600 calories each  day. The recommended range for total weight gain is 25-54 lb, depending on your pre-pregnancy BMI. Talk with your health care provider for specific guidance about additional nutritional needs, weight gain, and exercise during your pregnancy. What foods can I eat? Grains Any grains. Try to choose whole grains, such as whole-wheat bread, oatmeal, or  brown rice. Vegetables Any vegetables. Try to eat a variety of colors and types of vegetables to get a full range of vitamins and minerals. Remember to wash your vegetables well before eating. Fruits Any fruits. Try to eat a variety of colors and types of fruit to get a full range of vitamins and minerals. Remember to wash your fruits well before eating. Meats and Other Protein Sources Lean meats, including chicken, [place], fish, and lean cuts of beef, veal, or pork. Make sure that all meats are cooked to "well done." Tofu. Tempeh. Beans. Eggs. Peanut butter and other nut butters. Seafood, such as shrimp, crab, and lobster. If you choose fish, select types that are higher in omega-3 fatty acids, including salmon, herring, mussels, trout, sardines, and pollock. Make sure that all meats are cooked to food-safe temperatures. Dairy Pasteurized milk and milk alternatives. Pasteurized yogurt and pasteurized cheese. Cottage cheese. Sour cream. Beverages Water. Juices that contain 100% fruit juice or vegetable juice. Caffeine-free teas and decaffeinated coffee. Drinks that contain caffeine are okay to drink, but it is better to avoid caffeine. Keep your total caffeine intake to less than 200 mg each day (12 oz of coffee, tea, or soda) or as directed by your health care provider. Condiments Any pasteurized condiments. Sweets and Desserts Any sweets and desserts. Fats and Oils Any fats and oils. The items listed above may not be a complete list of recommended foods or beverages. Contact your dietitian for more options. What foods are not recommended? Vegetables Unpasteurized (raw) vegetable juices. Fruits Unpasteurized (raw) fruit juices. Meats and Other Protein Sources Cured meats that have nitrates, such as bacon, salami, and hotdogs. Luncheon meats, bologna, or other deli meats (unless they are reheated until they are steaming hot). Refrigerated pate, meat spreads from a meat counter, smoked seafood  that is found in the refrigerated section of a store. Raw fish, such as sushi or sashimi. High mercury content fish, such as tilefish, shark, swordfish, and king mackerel. Raw meats, such as tuna or beef tartare. Undercooked meats and poultry. Make sure that all meats are cooked to food-safe temperatures. Dairy Unpasteurized (raw) milk and any foods that have raw milk in them. Soft cheeses, such as feta, queso blanco, queso fresco, Brie, Camembert cheeses, blue-veined cheeses, and Panela cheese (unless it is made with pasteurized milk, which must be stated on the label). Beverages Alcohol. Sugar-sweetened beverages, such as sodas, teas, or energy drinks. Condiments Homemade fermented foods and drinks, such as pickles, sauerkraut, or kombucha drinks. (Store-bought pasteurized versions of these are okay.) Other Salads that are made in the store, such as ham salad, chicken salad, egg salad, tuna salad, and seafood salad. The items listed above may not be a complete list of foods and beverages to avoid. Contact your dietitian for more information. This information is not intended to replace advice given to you by your health care provider. Make sure you discuss any questions you have with your health care provider. Document Released: 12/19/2013 Document Revised: 08/12/2015 Document Reviewed: 08/19/2013 Elsevier Interactive Patient Education  [employer].

## 2017-06-20 DIAGNOSIS — Z3A10 10 weeks gestation of pregnancy: Secondary | ICD-10-CM | POA: Diagnosis not present

## 2017-06-20 DIAGNOSIS — Z118 Encounter for screening for other infectious and parasitic diseases: Secondary | ICD-10-CM | POA: Diagnosis not present

## 2017-06-20 DIAGNOSIS — O09521 Supervision of elderly multigravida, first trimester: Secondary | ICD-10-CM | POA: Diagnosis not present

## 2017-06-20 DIAGNOSIS — Z3689 Encounter for other specified antenatal screening: Secondary | ICD-10-CM | POA: Diagnosis not present

## 2017-06-20 LAB — OB RESULTS CONSOLE ABO/RH: RH Type: POSITIVE

## 2017-06-20 LAB — OB RESULTS CONSOLE HEPATITIS B SURFACE ANTIGEN: HEP B S AG: NEGATIVE

## 2017-06-20 LAB — OB RESULTS CONSOLE RUBELLA ANTIBODY, IGM: RUBELLA: IMMUNE

## 2017-06-20 LAB — OB RESULTS CONSOLE RPR: RPR: NONREACTIVE

## 2017-06-20 LAB — OB RESULTS CONSOLE GC/CHLAMYDIA
Chlamydia: NEGATIVE
Gonorrhea: NEGATIVE

## 2017-06-20 LAB — OB RESULTS CONSOLE HIV ANTIBODY (ROUTINE TESTING): HIV: NONREACTIVE

## 2017-06-20 LAB — OB RESULTS CONSOLE ANTIBODY SCREEN: ANTIBODY SCREEN: NEGATIVE

## 2017-06-21 ENCOUNTER — Other Ambulatory Visit: Payer: BLUE CROSS/BLUE SHIELD

## 2017-06-21 ENCOUNTER — Other Ambulatory Visit: Payer: Self-pay

## 2017-06-21 ENCOUNTER — Ambulatory Visit: Payer: BLUE CROSS/BLUE SHIELD | Admitting: Obstetrics & Gynecology

## 2017-07-12 DIAGNOSIS — Z3A13 13 weeks gestation of pregnancy: Secondary | ICD-10-CM | POA: Diagnosis not present

## 2017-07-12 DIAGNOSIS — Z3682 Encounter for antenatal screening for nuchal translucency: Secondary | ICD-10-CM | POA: Diagnosis not present

## 2017-07-12 DIAGNOSIS — O09521 Supervision of elderly multigravida, first trimester: Secondary | ICD-10-CM | POA: Diagnosis not present

## 2017-07-13 ENCOUNTER — Other Ambulatory Visit (HOSPITAL_COMMUNITY): Payer: Self-pay | Admitting: Obstetrics & Gynecology

## 2017-07-13 DIAGNOSIS — Z3689 Encounter for other specified antenatal screening: Secondary | ICD-10-CM

## 2017-07-13 DIAGNOSIS — Z3A14 14 weeks gestation of pregnancy: Secondary | ICD-10-CM

## 2017-07-13 DIAGNOSIS — IMO0002 Reserved for concepts with insufficient information to code with codable children: Secondary | ICD-10-CM

## 2017-07-13 DIAGNOSIS — O351XX0 Maternal care for (suspected) chromosomal abnormality in fetus, not applicable or unspecified: Secondary | ICD-10-CM

## 2017-07-16 ENCOUNTER — Encounter (HOSPITAL_COMMUNITY): Payer: Self-pay | Admitting: *Deleted

## 2017-07-17 ENCOUNTER — Ambulatory Visit (HOSPITAL_COMMUNITY)
Admission: RE | Admit: 2017-07-17 | Discharge: 2017-07-17 | Disposition: A | Discharge status: Home / Self Care | Payer: BLUE CROSS/BLUE SHIELD | Source: Ambulatory Visit | Attending: Obstetrics & Gynecology | Admitting: Obstetrics & Gynecology

## 2017-07-17 ENCOUNTER — Encounter (HOSPITAL_COMMUNITY): Payer: Self-pay

## 2017-07-17 ENCOUNTER — Other Ambulatory Visit: Payer: Self-pay

## 2017-07-17 ENCOUNTER — Other Ambulatory Visit (HOSPITAL_COMMUNITY): Payer: Self-pay | Admitting: Obstetrics & Gynecology

## 2017-07-17 ENCOUNTER — Other Ambulatory Visit (HOSPITAL_COMMUNITY): Payer: Self-pay | Admitting: *Deleted

## 2017-07-17 DIAGNOSIS — Z3A13 13 weeks gestation of pregnancy: Secondary | ICD-10-CM

## 2017-07-17 DIAGNOSIS — O283 Abnormal ultrasonic finding on antenatal screening of mother: Secondary | ICD-10-CM | POA: Diagnosis not present

## 2017-07-17 DIAGNOSIS — O289 Unspecified abnormal findings on antenatal screening of mother: Secondary | ICD-10-CM | POA: Diagnosis not present

## 2017-07-17 DIAGNOSIS — O351XX0 Maternal care for (suspected) chromosomal abnormality in fetus, not applicable or unspecified: Secondary | ICD-10-CM | POA: Diagnosis not present

## 2017-07-17 DIAGNOSIS — Z3A14 14 weeks gestation of pregnancy: Secondary | ICD-10-CM

## 2017-07-17 DIAGNOSIS — O09521 Supervision of elderly multigravida, first trimester: Secondary | ICD-10-CM | POA: Insufficient documentation

## 2017-07-17 DIAGNOSIS — O34219 Maternal care for unspecified type scar from previous cesarean delivery: Secondary | ICD-10-CM | POA: Insufficient documentation

## 2017-07-17 DIAGNOSIS — Z3689 Encounter for other specified antenatal screening: Secondary | ICD-10-CM

## 2017-07-17 DIAGNOSIS — Z315 Encounter for genetic counseling: Secondary | ICD-10-CM | POA: Insufficient documentation

## 2017-07-17 DIAGNOSIS — IMO0002 Reserved for concepts with insufficient information to code with codable children: Secondary | ICD-10-CM

## 2017-07-17 NOTE — Progress Notes (Signed)
Genetic Counseling  High-Risk Gestation Note  Appointment Date:  07/17/2017 Referred By: Azucena Fallen, MD Date of Birth:  12/13/1980 Partner:  Alcario Drought   Pregnancy History: [id number] Estimated Date of Delivery: 01/17/18 Estimated Gestational Age: 53w5dAttending: MGriffin Dakin MD   I met with Stephanie Harrington her husband, Mr. BWinnifred Dufford  for genetic counseling because of and increased fetal nuchal translucency visualized on outside ultrasound. The patient is 37y.o..   In summary:  [facility]  Ultrasound performed today; see separate report  Discussed AMA and associated risk for fetal aneuploidy  Reviewed results of previous NIPS screening- Panorama  Within normal limits for T21, T18, T13, monosomy X, and 22q  Discussed options for additional screening  NIPS/MaterniTGenome- declined  Ultrasound- detailed ultrasound scheduled 08/15/17  Discussed diagnostic testing options  Amniocentesis- declined  Discussed additional fetal risks due to increased NT  Heart defect- approximately 7% risk associated with NT measurement of 5.0  Discussed availability of fetal echocardiogram  This will be arranged pending detailed ultrasound on 08/15/17  Single gene condition  Discussed targeted anatomy ultrasound  Counseled about limitations in detection prenatally  Offered NIPS for single gene conditions ([facility])- declined today  Expanded carrier screening- declined today  Reviewed family history concerns   They were counseled regarding maternal age and the association with risk for chromosome conditions due to nondisjunction with aging of the ova.   We reviewed chromosomes, nondisjunction, and the associated risk for fetal aneuploidy related to a maternal age of 37years old.  She was counseled that the risk for aneuploidy decreases as gestational age increases, accounting for those pregnancies which  spontaneously abort.  We specifically discussed Down syndrome (trisomy 236, trisomies 148and 131 and sex chromosome aneuploidies (47,XXX and 47,XXY) including the common features and prognoses of each.   Mrs. LOsoriahad nuchal translucency assessment performed with her OB provider. NT was visualized to be 5.0 mm on that day of the exam, at reported crown rump length of 81. Ultrasound was performed today. Ultrasound was within normal limits for gestational age. The CClareon today's exam was technically to large for NT assessment, and detailed anatomy was limited by early gestational age. See separate ultrasound report. Detailed anatomy ultrasound is scheduled for 08/15/17.   We reviewed that the fetal NT refers to a fluid filled space between the skin and soft tissues behind the cervical spine. This space is traditionally measurable between 11 and 13.[redacted] weeks gestation and is considered enlarged when greater than the 95th percentile for gestational age. Mrs. LGoodgamewas counseled regarding the various common etiologies for an enlarged NT including: aneuploidy, single gene conditions, cardiac or great vessel abnormalities, lymphatic system failure, decreased fetal movement, and fetal anemia.   Regarding fetal aneuploidy, we reviewed other available screening options including noninvasive prenatal screening (NIPS) and detailed ultrasound. We reviewed the benefits and limitations of each option. Specifically, we discussed the conditions for which each test screens, the detection rates, and false positive rates of each.  Mrs. LEverlyhad NIPS/cfDNA testing through her primary obstetrician's office.  Specifically, she had Panorama through [employer] We reviewed that these results were within normal limits/low risk; revealing a less than 1:10,000 chance for fetal trisomies 21, 13, and 18 and monosomy X. Results were also low risk for 22q11.2 deletion syndrome.  Additionally, we discussed that the sex chromosome analysis was  normal, indicating that the fetus does not  have an extra sex chromosome and that the fetal sex is consistent with female. They understand that this screen is highly sensitive and specific but is not considered to be diagnostic. We discussed that NIPS (Panorama) does not assess for smaller chromosome aberrations. We discussed the screening option of MaterniTGenome, which is a NIPS platform that assess for fetal aneuploidy by screening all chromosomes. We reviewed benefits and limitations. They understand that while MaterniTGenome assesses for more chromosome conditions, it does not detect or rule out all chromosome conditions. [name] declined MaterniTGenome at this time.   Stephanie Harrington was then briefly counseled regarding diagnostic testing via amniocentesis for karyotype and chromosome microarray analysis.  We reviewed the associated 1 in [phone] risks for complications, including spontaneous pregnancy loss. We discussed the possible results that the tests might provide including: positive, negative, unanticipated, and no result. Finally, they were counseled regarding the cost of each option and potential out of pocket expenses. Stephanie Harrington declined amniocentesis given the associated risk for complications.   Regarding other etiologies for an increased NT, we discussed the increased risk for a fetal cardiac defect and less commonly other structural abnormalities. We discussed that with an NT measurement of 5.0 mm in the first trimester, the associated risk for underlying heart defect is approximately 7%. We reviewed the option of detailed ultrasound and fetal echocardiogram. Detailed ultrasound is scheduled in our office for 08/15/17.  Fetal echocardiogram will be scheduled pending results of 08/15/17 ultrasound.   We also discussed single gene conditions. They were counseled that an increased NT is associated with an increased chance for specific single gene conditions including Noonan spectrum disorders, skeletal  dysplasias, SLOS, CdLS, and many others. We discussed that these conditions are not routinely tested for prenatally unless ultrasound findings or family history significantly increase the suspicion of a specific single gene disorder; however, there is relatively new noninvasive prenatal screening (NIPS) technology which assesses for specific alterations in 30 genes, the majority of which occur de novo, including the genes associated with Noonan syndrome, some skeletal dysplasias, and CdLS. We discussed that this testing, marketed as Sales promotion account executive through [employer], is available. Both maternal and paternal sample is required for this screening. We reviewed the benefits, limitations, and cost of this technology. They understand that many of the features of these conditions can be detected by detailed ultrasound during the second trimester; however, ultrasound cannot definitively diagnose or rule out these conditions. We briefly reviewed common inheritance patterns (dominant, recessive, and X-linked) as well as the associated risks of recurrence.  After careful consideration, the couple declined single gene NIPS today but stated they would possibly consider pending future ultrasound results.   Regarding screening for autosomal recessive and X-linked conditions, we discussed the option of expanded carrier screening for the patient or her partner. We discussed benefits and limitations of expanded carrier screening. We discussed that prenatal screening and testing is not typically available for all single gene conditions associated with increased NT. We discussed the option of carrier screening for a select panel of autosomal recessive and some X-linked conditions, some of which but not all can have an increased NT as an associated feature. ACOG currently recommends that all patients be offered carrier screening for cystic fibrosis, spinal muscular atrophy and hemoglobinopathies. In addition, they were counseled that  there are a variety of genetic screening laboratories that have pan-ethnic, or expanded, carrier screening panels, which evaluate carrier status for a wide range of genetic conditions. Some of these conditions are severe  and actionable, but also rare; others occur more commonly, but are less severe. We discussed that testing options range from screening for a single condition to panels of more than 200 autosomal or X-linked genetic conditions. The prevalence of each condition varies (and often varies with ethnicity). Thus the couples' background risk to be a carrier for each of these various conditions would range, and in some cases be very low or unknown. We reviewed that a negative carrier screen would thus reduce, but not eliminate the chance to be a carrier for these conditions. For some conditions included on specific pan-ethnic carrier screening panels, the phenotype may not yet be well defined. For the majority of conditions on pan-ethnic carrier screening panels, identification of carrier status is not expected to be associated with medical features for the carrier; However, there are currently few exceptions where carrier status has been shown to increase the chance for certain medical concerns. We reviewed that in the event that one partner is found to be a carrier for one or more conditions, carrier screening would be available to the partner for those conditions. We discussed the risks, benefits, and limitations of carrier screening with the couple. After thoughtful consideration of their options, Mrs. RAMONA RUARK declined expanded pan-ethnic carrier screening (including ACOG recommended panel) at this time.    We also discussed that an increased NT value can be a normal variant, which can resolve during the pregnancy. They were counseled that the fetal prognosis depends on the underlying etiology of the enlarged NT and further anticipatory guidance can be provided if a diagnosis is discovered. The  couple indicated that they would plan to continue the pregnancy regardless of underlying etiology of increased NT.   Both family histories were reviewed and found to be noncontributory for birth defects and known genetic conditions. The couple reported that their two previous daughters are healthy and doing well. The reported infertility following the birth of their two daughters, and Mrs. Jacome reported that workup with reproductive endocrinology identified low egg reserve. She reported that she had Fragile X carrier screening through her REI provider, which was within normal limits.   Mr. Cullifer reported a paternal uncle with mild intellectual disability. He is not able to drive or read but is abe to live somewhat independently. He was not described to have dysmorphic features, and his intellectual disability was attributed to lack of oxygen at birth. No additional relatives were reported with similar concerns. The couple was counseled that there are many different causes of intellectual disabilities including environmental, multifactorial, and genetic etiologies.  We discussed that a specific diagnosis for intellectual disability can be determined in approximately 50% of these individuals.  In the remaining 50% of individuals, a diagnosis may never be determined.  Regarding genetic causes, we discussed that chromosome aberrations (aneuploidy, deletions, duplications, insertions, and translocations) are responsible for a small percentage of individuals with intellectual disability.  Many individuals with chromosome aberrations have additional differences, including congenital anomalies or minor dysmorphisms.  Likewise, single gene conditions are the underlying cause of intellectual delay in some families.  We discussed that many gene conditions have intellectual disability as a feature, but also often include other physical or medical differences.  We discussed that without more specific information, it is  difficult to provide an accurate risk assessment.  Further genetic counseling is warranted if more information is obtained.  Mr. Willhite reported that his mother had breast cancer diagnosed at age 38 years old. She is currently healthy  and doing well. Mr. Halperin father reportedly was diagnosed with colon cancer at age 78 years old and died at age 38 years old. Though most cancers are thought to be sporadic or due to environmental factors, some families appear to have a strong predisposition to cancers.  When considering a family history of cancer, we look for common types of cancer in multiple family members occurring at younger than typical ages. If desired, Mr. Jeon or his relatives have the option of meeting with a cancer genetic counselor to discuss any possible screening or testing options available. If they are concerned about the family history of cancer and would like to learn more about the family's chance for an inherited cancer syndrome, his doctor may refer him or his relatives to [facility] [phone]) or [facility] at [phone]. We discussed the importance of making his physicians aware of this history so that he is screened appropriately.   Mrs. Sholl reported that her mother died at age 67 years old from a heart attack. There is no additional reported family history of heart disease for the patient. We briefly reviewed the various etiologies of heart disease. It would be important for her physician to be aware of this history so that she is followed appropriately. If she or her relatives are interested in discussing the possibility of an underlying genetic component to the heart disease in the family, she may be referred to [facility] [phone]). Without further information regarding the provided family history, an accurate genetic risk cannot be calculated. Further genetic counseling is warranted if more  information is obtained.  Mrs. Willig denied exposure to environmental toxins or chemical agents. She denied the use of alcohol, tobacco or street drugs. She denied significant viral illnesses during the course of her pregnancy. Her medical and surgical histories were noncontributory.   I counseled this couple regarding the above risks and available options.  The approximate face-to-face time with the genetic counselor was 45 minutes.  Chipper Oman, MS,  Certified Genetic Counselor  07/19/2017

## 2017-07-19 DIAGNOSIS — Z3A13 13 weeks gestation of pregnancy: Secondary | ICD-10-CM | POA: Insufficient documentation

## 2017-07-30 ENCOUNTER — Other Ambulatory Visit: Payer: Self-pay | Admitting: Obstetrics & Gynecology

## 2017-08-03 DIAGNOSIS — O09521 Supervision of elderly multigravida, first trimester: Secondary | ICD-10-CM | POA: Diagnosis not present

## 2017-08-03 DIAGNOSIS — Z361 Encounter for antenatal screening for raised alphafetoprotein level: Secondary | ICD-10-CM | POA: Diagnosis not present

## 2017-08-03 DIAGNOSIS — Z3A16 16 weeks gestation of pregnancy: Secondary | ICD-10-CM | POA: Diagnosis not present

## 2017-08-15 ENCOUNTER — Ambulatory Visit (HOSPITAL_COMMUNITY)
Admission: RE | Admit: 2017-08-15 | Discharge: 2017-08-15 | Disposition: A | Discharge status: Home / Self Care | Payer: BLUE CROSS/BLUE SHIELD | Source: Ambulatory Visit | Attending: Obstetrics & Gynecology | Admitting: Obstetrics & Gynecology

## 2017-08-15 ENCOUNTER — Encounter (HOSPITAL_COMMUNITY): Payer: Self-pay

## 2017-08-15 ENCOUNTER — Other Ambulatory Visit (HOSPITAL_COMMUNITY): Payer: Self-pay | Admitting: Obstetrics and Gynecology

## 2017-08-15 DIAGNOSIS — Z363 Encounter for antenatal screening for malformations: Secondary | ICD-10-CM

## 2017-08-15 DIAGNOSIS — Z98891 History of uterine scar from previous surgery: Secondary | ICD-10-CM

## 2017-08-15 DIAGNOSIS — O283 Abnormal ultrasonic finding on antenatal screening of mother: Secondary | ICD-10-CM | POA: Insufficient documentation

## 2017-08-15 DIAGNOSIS — O34219 Maternal care for unspecified type scar from previous cesarean delivery: Secondary | ICD-10-CM | POA: Insufficient documentation

## 2017-08-15 DIAGNOSIS — O09522 Supervision of elderly multigravida, second trimester: Secondary | ICD-10-CM

## 2017-08-15 DIAGNOSIS — Z3A17 17 weeks gestation of pregnancy: Secondary | ICD-10-CM | POA: Insufficient documentation

## 2017-08-15 DIAGNOSIS — O289 Unspecified abnormal findings on antenatal screening of mother: Secondary | ICD-10-CM

## 2017-08-29 DIAGNOSIS — O09522 Supervision of elderly multigravida, second trimester: Secondary | ICD-10-CM | POA: Diagnosis not present

## 2017-08-29 DIAGNOSIS — Z3A20 20 weeks gestation of pregnancy: Secondary | ICD-10-CM | POA: Diagnosis not present

## 2017-08-30 ENCOUNTER — Encounter (HOSPITAL_COMMUNITY): Payer: Self-pay

## 2017-09-27 DIAGNOSIS — O09522 Supervision of elderly multigravida, second trimester: Secondary | ICD-10-CM | POA: Diagnosis not present

## 2017-09-27 DIAGNOSIS — Z3A24 24 weeks gestation of pregnancy: Secondary | ICD-10-CM | POA: Diagnosis not present

## 2017-10-24 DIAGNOSIS — Z3689 Encounter for other specified antenatal screening: Secondary | ICD-10-CM | POA: Diagnosis not present

## 2017-10-24 DIAGNOSIS — O09523 Supervision of elderly multigravida, third trimester: Secondary | ICD-10-CM | POA: Diagnosis not present

## 2017-10-24 DIAGNOSIS — Z23 Encounter for immunization: Secondary | ICD-10-CM | POA: Diagnosis not present

## 2017-10-24 DIAGNOSIS — Z3A28 28 weeks gestation of pregnancy: Secondary | ICD-10-CM | POA: Diagnosis not present

## 2017-11-08 DIAGNOSIS — F4320 Adjustment disorder, unspecified: Secondary | ICD-10-CM | POA: Diagnosis not present

## 2017-11-09 DIAGNOSIS — Z3A30 30 weeks gestation of pregnancy: Secondary | ICD-10-CM | POA: Diagnosis not present

## 2017-11-09 DIAGNOSIS — O09523 Supervision of elderly multigravida, third trimester: Secondary | ICD-10-CM | POA: Diagnosis not present

## 2017-11-21 DIAGNOSIS — O09523 Supervision of elderly multigravida, third trimester: Secondary | ICD-10-CM | POA: Diagnosis not present

## 2017-11-21 DIAGNOSIS — Z3A32 32 weeks gestation of pregnancy: Secondary | ICD-10-CM | POA: Diagnosis not present

## 2017-12-05 DIAGNOSIS — O09523 Supervision of elderly multigravida, third trimester: Secondary | ICD-10-CM | POA: Diagnosis not present

## 2017-12-05 DIAGNOSIS — Z3A34 34 weeks gestation of pregnancy: Secondary | ICD-10-CM | POA: Diagnosis not present

## 2017-12-19 DIAGNOSIS — O09523 Supervision of elderly multigravida, third trimester: Secondary | ICD-10-CM | POA: Diagnosis not present

## 2017-12-19 DIAGNOSIS — Z3A36 36 weeks gestation of pregnancy: Secondary | ICD-10-CM | POA: Diagnosis not present

## 2017-12-19 DIAGNOSIS — Z3685 Encounter for antenatal screening for Streptococcus B: Secondary | ICD-10-CM | POA: Diagnosis not present

## 2017-12-25 ENCOUNTER — Encounter (HOSPITAL_COMMUNITY): Payer: Self-pay

## 2017-12-26 ENCOUNTER — Other Ambulatory Visit: Payer: Self-pay | Admitting: Obstetrics & Gynecology

## 2017-12-27 DIAGNOSIS — Z3689 Encounter for other specified antenatal screening: Secondary | ICD-10-CM | POA: Diagnosis not present

## 2017-12-27 DIAGNOSIS — Z3A37 37 weeks gestation of pregnancy: Secondary | ICD-10-CM | POA: Diagnosis not present

## 2017-12-27 DIAGNOSIS — Z23 Encounter for immunization: Secondary | ICD-10-CM | POA: Diagnosis not present

## 2017-12-27 DIAGNOSIS — O09523 Supervision of elderly multigravida, third trimester: Secondary | ICD-10-CM | POA: Diagnosis not present

## 2018-01-01 DIAGNOSIS — Z3A38 38 weeks gestation of pregnancy: Secondary | ICD-10-CM | POA: Diagnosis not present

## 2018-01-01 DIAGNOSIS — O09523 Supervision of elderly multigravida, third trimester: Secondary | ICD-10-CM | POA: Diagnosis not present

## 2018-01-07 NOTE — Patient Instructions (Signed)
Stephanie Harrington  01/07/2018   Your procedure is scheduled on:  01/10/2018  Enter through the Main Entrance of [facility] at 0530 AM.  Pick up the phone at the desk and dial [phone]  Call this number if you have problems the morning of surgery:[phone]  Remember:   Do not eat food:(After Midnight) Desps de medianoche.  Do not drink clear liquids: (After Midnight) Desps de medianoche.  Take these medicines the morning of surgery with A SIP OF WATER: none   Do not wear jewelry, make-up or nail polish.  Do not wear lotions, powders, or perfumes. Do not wear deodorant.  Do not shave 48 hours prior to surgery.  Do not bring valuables to the hospital.  [facility] is not   responsible for any belongings or valuables brought to the hospital.  Contacts, dentures or bridgework may not be worn into surgery.  Leave suitcase in the car. After surgery it may be brought to your room.  For patients admitted to the hospital, checkout time is 11:00 AM the day of              discharge.    N/A   Please read over the following fact sheets that you were given:   Surgical Site Infection Prevention

## 2018-01-09 ENCOUNTER — Encounter (HOSPITAL_COMMUNITY)
Admission: RE | Admit: 2018-01-09 | Discharge: 2018-01-09 | Disposition: A | Discharge status: Home / Self Care | Payer: BLUE CROSS/BLUE SHIELD | Source: Ambulatory Visit | Attending: Obstetrics & Gynecology | Admitting: Obstetrics & Gynecology

## 2018-01-09 DIAGNOSIS — O9962 Diseases of the digestive system complicating childbirth: Secondary | ICD-10-CM | POA: Diagnosis not present

## 2018-01-09 DIAGNOSIS — D649 Anemia, unspecified: Secondary | ICD-10-CM | POA: Diagnosis not present

## 2018-01-09 DIAGNOSIS — O34219 Maternal care for unspecified type scar from previous cesarean delivery: Secondary | ICD-10-CM | POA: Diagnosis not present

## 2018-01-09 DIAGNOSIS — Z3A39 39 weeks gestation of pregnancy: Secondary | ICD-10-CM | POA: Diagnosis not present

## 2018-01-09 DIAGNOSIS — Z87891 Personal history of nicotine dependence: Secondary | ICD-10-CM | POA: Diagnosis not present

## 2018-01-09 DIAGNOSIS — O34211 Maternal care for low transverse scar from previous cesarean delivery: Secondary | ICD-10-CM | POA: Diagnosis not present

## 2018-01-09 DIAGNOSIS — O9902 Anemia complicating childbirth: Secondary | ICD-10-CM | POA: Diagnosis not present

## 2018-01-09 DIAGNOSIS — K219 Gastro-esophageal reflux disease without esophagitis: Secondary | ICD-10-CM | POA: Diagnosis not present

## 2018-01-09 LAB — CBC
HEMATOCRIT: 37.4 % (ref 36.0–46.0)
Hemoglobin: 12.9 g/dL (ref 12.0–15.0)
MCH: 28.7 pg (ref 26.0–34.0)
MCHC: 34.5 g/dL (ref 30.0–36.0)
MCV: 83.3 fL (ref 80.0–100.0)
NRBC: 0 % (ref 0.0–0.2)
PLATELETS: 209 10*3/uL (ref 150–400)
RBC: 4.49 MIL/uL (ref 3.87–5.11)
RDW: 12.9 % (ref 11.5–15.5)
WBC: 7.6 10*3/uL (ref 4.0–10.5)

## 2018-01-09 LAB — TYPE AND SCREEN
ABO/RH(D): A POS
Antibody Screen: NEGATIVE

## 2018-01-10 ENCOUNTER — Inpatient Hospital Stay (HOSPITAL_COMMUNITY)
Admission: RE | Admit: 2018-01-10 | Discharge: 2018-01-12 | DRG: 788 | Disposition: A | Discharge status: Home / Self Care | Payer: BLUE CROSS/BLUE SHIELD | Attending: Obstetrics & Gynecology | Admitting: Obstetrics & Gynecology

## 2018-01-10 ENCOUNTER — Encounter (HOSPITAL_COMMUNITY): Payer: Self-pay

## 2018-01-10 ENCOUNTER — Encounter (HOSPITAL_COMMUNITY)
Admission: RE | Admit: 2018-01-10 | Discharge: 2018-01-10 | Disposition: A | Discharge status: Home / Self Care | Payer: BLUE CROSS/BLUE SHIELD | Source: Ambulatory Visit | Attending: Obstetrics & Gynecology | Admitting: Obstetrics & Gynecology

## 2018-01-10 ENCOUNTER — Inpatient Hospital Stay (HOSPITAL_COMMUNITY): Payer: BLUE CROSS/BLUE SHIELD | Admitting: Anesthesiology

## 2018-01-10 ENCOUNTER — Encounter (HOSPITAL_COMMUNITY)
Admission: RE | Disposition: A | Discharge status: Home / Self Care | Payer: Self-pay | Source: Home / Self Care | Attending: Obstetrics & Gynecology

## 2018-01-10 DIAGNOSIS — D649 Anemia, unspecified: Secondary | ICD-10-CM | POA: Diagnosis present

## 2018-01-10 DIAGNOSIS — Z3A39 39 weeks gestation of pregnancy: Secondary | ICD-10-CM

## 2018-01-10 DIAGNOSIS — O9962 Diseases of the digestive system complicating childbirth: Secondary | ICD-10-CM | POA: Diagnosis present

## 2018-01-10 DIAGNOSIS — O9902 Anemia complicating childbirth: Secondary | ICD-10-CM | POA: Diagnosis present

## 2018-01-10 DIAGNOSIS — O34211 Maternal care for low transverse scar from previous cesarean delivery: Secondary | ICD-10-CM | POA: Diagnosis not present

## 2018-01-10 DIAGNOSIS — Z87891 Personal history of nicotine dependence: Secondary | ICD-10-CM

## 2018-01-10 DIAGNOSIS — Z98891 History of uterine scar from previous surgery: Secondary | ICD-10-CM

## 2018-01-10 DIAGNOSIS — K219 Gastro-esophageal reflux disease without esophagitis: Secondary | ICD-10-CM | POA: Diagnosis present

## 2018-01-10 DIAGNOSIS — O34219 Maternal care for unspecified type scar from previous cesarean delivery: Secondary | ICD-10-CM | POA: Diagnosis not present

## 2018-01-10 LAB — RPR: RPR: NONREACTIVE

## 2018-01-10 SURGERY — Surgical Case
Anesthesia: Spinal

## 2018-01-10 MED ORDER — OXYTOCIN 40 UNITS IN LACTATED RINGERS INFUSION - SIMPLE MED: 2.5000 [IU]/h | INTRAVENOUS | Status: AC

## 2018-01-10 MED ORDER — FENTANYL CITRATE (PF) 100 MCG/2ML IJ SOLN: 25.0000 ug | INTRAMUSCULAR | Status: DC | PRN

## 2018-01-10 MED ORDER — DIPHENHYDRAMINE HCL 50 MG/ML IJ SOLN
12.5000 mg | Freq: Once | INTRAMUSCULAR | Status: AC
  Administered 2018-01-10: 12.5 mg via INTRAVENOUS

## 2018-01-10 MED ORDER — SIMETHICONE 80 MG PO CHEW
80.0000 mg | CHEWABLE_TABLET | Freq: Three times a day (TID) | ORAL | Status: DC
  Administered 2018-01-11 – 2018-01-12 (×5): 80 mg via ORAL
  Filled 2018-01-10 (×5): qty 1

## 2018-01-10 MED ORDER — OXYCODONE HCL 5 MG PO TABS
5.0000 mg | ORAL_TABLET | ORAL | Status: DC | PRN
  Administered 2018-01-11 – 2018-01-12 (×3): 5 mg via ORAL
  Filled 2018-01-10 (×3): qty 1

## 2018-01-10 MED ORDER — CEFAZOLIN SODIUM-DEXTROSE 2-4 GM/100ML-% IV SOLN: 2.0000 g | INTRAVENOUS | Status: DC

## 2018-01-10 MED ORDER — DEXAMETHASONE SODIUM PHOSPHATE 4 MG/ML IJ SOLN
INTRAMUSCULAR | Status: DC | PRN
  Administered 2018-01-10: 4 mg via INTRAVENOUS

## 2018-01-10 MED ORDER — IBUPROFEN 600 MG PO TABS
600.0000 mg | ORAL_TABLET | Freq: Four times a day (QID) | ORAL | Status: DC
  Administered 2018-01-10 – 2018-01-12 (×8): 600 mg via ORAL
  Filled 2018-01-10 (×8): qty 1

## 2018-01-10 MED ORDER — OXYTOCIN 10 UNIT/ML IJ SOLN
INTRAVENOUS | Status: DC | PRN
  Administered 2018-01-10: 40 [IU] via INTRAVENOUS

## 2018-01-10 MED ORDER — COCONUT OIL OIL: 1.0000 "application " | TOPICAL_OIL | Status: DC | PRN

## 2018-01-10 MED ORDER — WITCH HAZEL-GLYCERIN EX PADS: 1.0000 "application " | MEDICATED_PAD | CUTANEOUS | Status: DC | PRN

## 2018-01-10 MED ORDER — LACTATED RINGERS IV SOLN
INTRAVENOUS | Status: DC
  Administered 2018-01-10: 21:00:00 via INTRAVENOUS

## 2018-01-10 MED ORDER — ACETAMINOPHEN 325 MG PO TABS
650.0000 mg | ORAL_TABLET | ORAL | Status: DC | PRN
  Administered 2018-01-10 – 2018-01-12 (×4): 650 mg via ORAL
  Filled 2018-01-10 (×4): qty 2

## 2018-01-10 MED ORDER — DIPHENHYDRAMINE HCL 25 MG PO CAPS: 25.0000 mg | ORAL_CAPSULE | Freq: Four times a day (QID) | ORAL | Status: DC | PRN

## 2018-01-10 MED ORDER — PANTOPRAZOLE SODIUM 20 MG PO TBEC
20.0000 mg | DELAYED_RELEASE_TABLET | Freq: Every day | ORAL | Status: DC
  Administered 2018-01-12: 20 mg via ORAL
  Filled 2018-01-10 (×4): qty 1

## 2018-01-10 MED ORDER — BUPIVACAINE IN DEXTROSE 0.75-8.25 % IT SOLN
INTRATHECAL | Status: DC | PRN
  Administered 2018-01-10: 1.6 mL via INTRATHECAL

## 2018-01-10 MED ORDER — ACETAMINOPHEN 10 MG/ML IV SOLN
1000.0000 mg | Freq: Once | INTRAVENOUS | Status: DC | PRN
  Administered 2018-01-10: 1000 mg via INTRAVENOUS

## 2018-01-10 MED ORDER — SIMETHICONE 80 MG PO CHEW
80.0000 mg | CHEWABLE_TABLET | ORAL | Status: DC
  Administered 2018-01-11 (×2): 80 mg via ORAL
  Filled 2018-01-10 (×2): qty 1

## 2018-01-10 MED ORDER — OXYTOCIN 10 UNIT/ML IJ SOLN
INTRAMUSCULAR | Status: AC
  Filled 2018-01-10: qty 4

## 2018-01-10 MED ORDER — EPHEDRINE SULFATE-NACL 50-0.9 MG/10ML-% IV SOSY
PREFILLED_SYRINGE | INTRAVENOUS | Status: DC | PRN
  Administered 2018-01-10: 10 mg via INTRAVENOUS

## 2018-01-10 MED ORDER — ACETAMINOPHEN 10 MG/ML IV SOLN
INTRAVENOUS | Status: AC
  Filled 2018-01-10: qty 100

## 2018-01-10 MED ORDER — DIPHENHYDRAMINE HCL 50 MG/ML IJ SOLN
INTRAMUSCULAR | Status: AC
  Filled 2018-01-10: qty 1

## 2018-01-10 MED ORDER — DIBUCAINE 1 % RE OINT: 1.0000 "application " | TOPICAL_OINTMENT | RECTAL | Status: DC | PRN

## 2018-01-10 MED ORDER — MENTHOL 3 MG MT LOZG: 1.0000 | LOZENGE | OROMUCOSAL | Status: DC | PRN

## 2018-01-10 MED ORDER — TETANUS-DIPHTH-ACELL PERTUSSIS 5-2.5-18.5 LF-MCG/0.5 IM SUSP: 0.5000 mL | Freq: Once | INTRAMUSCULAR | Status: DC

## 2018-01-10 MED ORDER — OXYCODONE HCL 5 MG PO TABS: 10.0000 mg | ORAL_TABLET | ORAL | Status: DC | PRN

## 2018-01-10 MED ORDER — FENTANYL CITRATE (PF) 100 MCG/2ML IJ SOLN
INTRAMUSCULAR | Status: AC
  Filled 2018-01-10: qty 2

## 2018-01-10 MED ORDER — PHENYLEPHRINE 8 MG IN D5W 100 ML (0.08MG/ML) PREMIX OPTIME
INJECTION | INTRAVENOUS | Status: DC | PRN
  Administered 2018-01-10: 20 ug/min via INTRAVENOUS

## 2018-01-10 MED ORDER — DEXAMETHASONE SODIUM PHOSPHATE 4 MG/ML IJ SOLN
INTRAMUSCULAR | Status: AC
  Filled 2018-01-10: qty 1

## 2018-01-10 MED ORDER — LACTATED RINGERS IV SOLN
INTRAVENOUS | Status: DC | PRN
  Administered 2018-01-10: 08:00:00 via INTRAVENOUS

## 2018-01-10 MED ORDER — FENTANYL CITRATE (PF) 100 MCG/2ML IJ SOLN
INTRAMUSCULAR | Status: DC | PRN
  Administered 2018-01-10: 15 ug via INTRAVENOUS

## 2018-01-10 MED ORDER — KETOROLAC TROMETHAMINE 30 MG/ML IJ SOLN
INTRAMUSCULAR | Status: AC
  Filled 2018-01-10: qty 1

## 2018-01-10 MED ORDER — ONDANSETRON HCL 4 MG/2ML IJ SOLN
INTRAMUSCULAR | Status: DC | PRN
  Administered 2018-01-10: 4 mg via INTRAVENOUS

## 2018-01-10 MED ORDER — MORPHINE SULFATE (PF) 0.5 MG/ML IJ SOLN
INTRAMUSCULAR | Status: AC
  Filled 2018-01-10: qty 10

## 2018-01-10 MED ORDER — ONDANSETRON HCL 4 MG/2ML IJ SOLN
INTRAMUSCULAR | Status: AC
  Filled 2018-01-10: qty 2

## 2018-01-10 MED ORDER — SENNOSIDES-DOCUSATE SODIUM 8.6-50 MG PO TABS
2.0000 | ORAL_TABLET | ORAL | Status: DC
  Administered 2018-01-11 (×2): 2 via ORAL
  Filled 2018-01-10 (×2): qty 2

## 2018-01-10 MED ORDER — MORPHINE SULFATE (PF) 0.5 MG/ML IJ SOLN
INTRAMUSCULAR | Status: DC | PRN
  Administered 2018-01-10: .15 mg via EPIDURAL

## 2018-01-10 MED ORDER — PRENATAL MULTIVITAMIN CH
1.0000 | ORAL_TABLET | Freq: Every day | ORAL | Status: DC
  Administered 2018-01-11 – 2018-01-12 (×2): 1 via ORAL
  Filled 2018-01-10 (×3): qty 1

## 2018-01-10 MED ORDER — ZOLPIDEM TARTRATE 5 MG PO TABS: 5.0000 mg | ORAL_TABLET | Freq: Every evening | ORAL | Status: DC | PRN

## 2018-01-10 MED ORDER — SIMETHICONE 80 MG PO CHEW: 80.0000 mg | CHEWABLE_TABLET | ORAL | Status: DC | PRN

## 2018-01-10 MED ORDER — LACTATED RINGERS IV SOLN
INTRAVENOUS | Status: DC
  Administered 2018-01-10 (×2): via INTRAVENOUS

## 2018-01-10 MED ORDER — KETOROLAC TROMETHAMINE 30 MG/ML IJ SOLN
30.0000 mg | Freq: Once | INTRAMUSCULAR | Status: AC | PRN
  Administered 2018-01-10: 30 mg via INTRAVENOUS

## 2018-01-10 SURGICAL SUPPLY — 42 items
APL SKNCLS STERI-STRIP NONHPOA (GAUZE/BANDAGES/DRESSINGS) ×1
BENZOIN TINCTURE PRP APPL 2/3 (GAUZE/BANDAGES/DRESSINGS) ×3 IMPLANT
CHLORAPREP W/TINT 26ML (MISCELLANEOUS) ×3 IMPLANT
CLAMP CORD UMBIL (MISCELLANEOUS) IMPLANT
CLOSURE STERI-STRIP 1/2X4 (GAUZE/BANDAGES/DRESSINGS) ×1
CLOSURE WOUND 1/2 X4 (GAUZE/BANDAGES/DRESSINGS) ×1
CLOTH BEACON ORANGE TIMEOUT ST (SAFETY) ×3 IMPLANT
CLSR STERI-STRIP ANTIMIC 1/2X4 (GAUZE/BANDAGES/DRESSINGS) ×1 IMPLANT
DRSG OPSITE POSTOP 4X10 (GAUZE/BANDAGES/DRESSINGS) ×3 IMPLANT
ELECT REM PT RETURN 9FT ADLT (ELECTROSURGICAL) ×3
ELECTRODE REM PT RTRN 9FT ADLT (ELECTROSURGICAL) ×1 IMPLANT
EXTRACTOR VACUUM KIWI (MISCELLANEOUS) IMPLANT
EXTRACTOR VACUUM M CUP 4 TUBE (SUCTIONS) ×2 IMPLANT
EXTRACTOR VACUUM M CUP 4' TUBE (SUCTIONS) ×1
GAUZE SPONGE 4X4 12PLY STRL LF (GAUZE/BANDAGES/DRESSINGS) ×3 IMPLANT
GLOVE BIO SURGEON STRL SZ7 (GLOVE) ×3 IMPLANT
GLOVE BIOGEL PI IND STRL 7.0 (GLOVE) ×2 IMPLANT
GLOVE BIOGEL PI INDICATOR 7.0 (GLOVE) ×4
GOWN STRL REUS W/TWL LRG LVL3 (GOWN DISPOSABLE) ×6 IMPLANT
KIT ABG SYR 3ML LUER SLIP (SYRINGE) IMPLANT
NDL HYPO 25X5/8 SAFETYGLIDE (NEEDLE) IMPLANT
NEEDLE HYPO 25X5/8 SAFETYGLIDE (NEEDLE) IMPLANT
NS IRRIG 1000ML POUR BTL (IV SOLUTION) ×3 IMPLANT
PACK C SECTION WH (CUSTOM PROCEDURE TRAY) ×3 IMPLANT
PAD ABD 7.5X8 STRL (GAUZE/BANDAGES/DRESSINGS) ×3 IMPLANT
PAD OB MATERNITY 4.3X12.25 (PERSONAL CARE ITEMS) ×3 IMPLANT
RTRCTR C-SECT PINK 25CM LRG (MISCELLANEOUS) IMPLANT
SPONGE LAP 18X18 RF (DISPOSABLE) ×9 IMPLANT
STRIP CLOSURE SKIN 1/2X4 (GAUZE/BANDAGES/DRESSINGS) ×1 IMPLANT
SUT MNCRL 0 VIOLET CTX 36 (SUTURE) ×2 IMPLANT
SUT MONOCRYL 0 CTX 36 (SUTURE) ×4
SUT PLAIN 0 NONE (SUTURE) IMPLANT
SUT PLAIN 2 0 (SUTURE)
SUT PLAIN ABS 2-0 CT1 27XMFL (SUTURE) IMPLANT
SUT VIC AB 0 CT1 27 (SUTURE) ×6
SUT VIC AB 0 CT1 27XBRD ANBCTR (SUTURE) ×2 IMPLANT
SUT VIC AB 2-0 CT1 27 (SUTURE) ×3
SUT VIC AB 2-0 CT1 TAPERPNT 27 (SUTURE) ×1 IMPLANT
SUT VIC AB 4-0 KS 27 (SUTURE) ×3 IMPLANT
SUT VICRYL 0 TIES 12 18 (SUTURE) IMPLANT
TOWEL OR 17X24 6PK STRL BLUE (TOWEL DISPOSABLE) ×3 IMPLANT
TRAY FOLEY W/BAG SLVR 14FR LF (SET/KITS/TRAYS/PACK) IMPLANT

## 2018-01-10 NOTE — Anesthesia Procedure Notes (Signed)
Spinal  Patient location during procedure: OR Start time: 01/10/2018 7:25 AM End time: 01/10/2018 7:30 AM Staffing Anesthesiologist: Elmer Picker, MD Resident/CRNA: Armanda Heritage, CRNA Other anesthesia staff: Macie Burows, RN Performed: other anesthesia staff  Preanesthetic Checklist Completed: patient identified, site marked, surgical consent, pre-op evaluation, timeout performed, IV checked, risks and benefits discussed and monitors and equipment checked Spinal Block Patient position: sitting Prep: DuraPrep Patient monitoring: heart rate and continuous pulse ox Approach: midline Location: L3-4 Injection technique: single-shot Needle Needle type: Pencan  Needle gauge: 24 G Needle length: 10 cm Needle insertion depth: 9 cm Assessment Sensory level: T4

## 2018-01-10 NOTE — Transfer of Care (Signed)
Immediate Anesthesia Transfer of Care Note  Patient: Stephanie Harrington  Procedure(s) Performed: Repeat CESAREAN SECTION (N/A )  Patient Location: PACU  Anesthesia Type:Spinal  Level of Consciousness: awake, alert  and oriented  Airway & Oxygen Therapy: Patient Spontanous Breathing  Post-op Assessment: Report given to RN  Post vital signs: Reviewed and stable  Last Vitals:  Vitals Value Taken Time  BP 114/76 01/10/2018  8:40 AM  Temp    Pulse 79 01/10/2018  8:43 AM  Resp 23 01/10/2018  8:43 AM  SpO2 100 % 01/10/2018  8:43 AM  Vitals shown include unvalidated device data.  Last Pain:  Vitals:   01/10/18 0541  TempSrc: Oral         Complications: No apparent anesthesia complications

## 2018-01-10 NOTE — H&P (Signed)
Stephanie Harrington is a 37 y.o. female [phone] presenting for repeat C-section at 39 wks.  Spontaneous pregnancy after failed IVF x 2. AMA. Increased NT in 1st trimester, normal Panorama, XY and normal anatomy sono with MFM, normal growth x2 in 3rd trim.  GERD  OB History    Gravida  4   Para  2   Term  2   Preterm      AB  1   Living  2     SAB  1   TAB      Ectopic      Multiple      Live Births  2          Past Medical History:  Diagnosis Date  . Anemia    childhood  . Depression   . GERD (gastroesophageal reflux disease)    no meds  . H/O varicella   . Hemorrhoid   . IBS (irritable bowel syndrome)   . Pill esophagitis due to doxycycline 12/29/2016  . Pneumonia    walking pneumonia in childhood  . Postpartum care following cesarean delivery (9/30) 12/17/2013  . Recurrent upper respiratory infection (URI)    Past Surgical History:  Procedure Laterality Date  . addenoidectomy    . CESAREAN SECTION  11/18/2011   Procedure: CESAREAN SECTION;  Surgeon: Robley Fries, MD;  Location: [facility];  Service: Gynecology;  Laterality: N/A;  Primary cesarean section of baby girl at 2013  APGAR 9/9  . CESAREAN SECTION N/A 12/17/2013   Procedure: Repeat CESAREAN SECTION;  Surgeon: Robley Fries, MD;  Location: [facility];  Service: Obstetrics;  Laterality: N/A;  EDD: 12/17/13  . COLONOSCOPY    . DILATION AND EVACUATION  12/19/2010   Procedure: DILATATION AND EVACUATION (D&E);  Surgeon: Robley Fries;  Location: [facility];  Service: Gynecology;  Laterality: N/A;  . TONSILLECTOMY    . UMBILICAL HERNIA REPAIR N/A 07/27/2015   Procedure:  REPAIR UMBILICAL HERNIA;  Surgeon: Harriette Bouillon, MD;  Location: [facility];  Service: General;  Laterality: N/A;  . WISDOM TOOTH EXTRACTION     Family History: family history includes Cancer in her father and maternal grandmother; Coronary artery disease in her maternal grandmother; Heart attack in her mother; Heart disease in her  maternal grandmother and mother; Hyperlipidemia in her father; Hypertension in her father; Lymphoma in her father. Social History:  reports that she quit smoking about 8 years ago. Her smoking use included cigarettes. She has a 2.25 pack-year smoking history. She has never used smokeless tobacco. She reports that she drinks alcohol. She reports that she does not use drugs.     Maternal Diabetes: No Genetic Screening: Normal Maternal Ultrasounds/Referrals: Normal except increased Nuchal translucency in 1st trim,  Fetal Ultrasounds or other Referrals:  Referred to Materal Fetal Medicine  Maternal Substance Abuse:  No Significant Maternal Medications:  Protonix, Pepcid  Significant Maternal Lab Results:  Lab values include: Group B Strep negative Other Comments:  None  ROS History   Blood pressure 123/85, pulse 75, temperature 98.4 F (36.9 C), temperature source Oral, resp. rate 18, last menstrual period 04/12/2017, SpO2 99 %. Exam Physical Exam  Physical exam:  A&O x 3, no acute distress. Pleasant HEENT neg, no thyromegaly Lungs CTA bilat CV RRR, S1S2 normal Abdo soft, non tender, non acute Extr no edema/ tenderness Pelvic def FHT  130s Toco none  Prenatal labs: ABO, Rh: --/--/A POS (10/23 1000) Antibody: NEG (10/23 1000) Rubella: Immune (04/03  0000) RPR: Non Reactive (10/23 1000)  HBsAg: Negative (04/03 0000)  HIV: Non-reactive (04/03 0000)  GBS:   negative GLucola normal  Assessment/Plan: 37 yo G4P2012 at 39 wks, for repeat 3rd C/section. Declines sterliization.  Risks/complications of surgery reviewed incl infection, bleeding, damage to internal organs including bladder, bowels, ureters, blood vessels, other risks from anesthesia, VTE and delayed complications of any surgery, complications in future surgery reviewed. Also discussed neonatal complications incl difficult delivery, laceration, vacuum assistance, TTN etc. Pt understands and agrees, all concerns addressed.     Robley Fries 01/10/2018, 6:24 AM

## 2018-01-10 NOTE — Anesthesia Postprocedure Evaluation (Signed)
Anesthesia Post Note  Patient: SHANTERIA LAYE  Procedure(s) Performed: Repeat CESAREAN SECTION (N/A )     Patient location during evaluation: PACU Anesthesia Type: Spinal Level of consciousness: oriented and awake and alert Pain management: pain level controlled Vital Signs Assessment: post-procedure vital signs reviewed and stable Respiratory status: spontaneous breathing, respiratory function stable and patient connected to nasal cannula oxygen Cardiovascular status: blood pressure returned to baseline and stable Postop Assessment: no headache, no backache and no apparent nausea or vomiting Anesthetic complications: no    Last Vitals:  Vitals:   01/10/18 1210 01/10/18 1303  BP: 136/81 122/85  Pulse: (!) 54 (!) 56  Resp: 18 18  Temp: 36.6 C 36.6 C  SpO2: 97% 98%    Last Pain:  Vitals:   01/10/18 1306  TempSrc:   PainSc: 1    Pain Goal: Patients Stated Pain Goal: 4 (01/10/18 0930)               Danita Proud L Cayleb Jarnigan

## 2018-01-10 NOTE — Lactation Note (Signed)
This note was copied from a baby's chart. Lactation Consultation Note  Patient Name: Stephanie Harrington [id number] Date: 01/10/2018 Reason for consult: Initial assessment;Term  Baby is 31 hours old  LC reviewed and updated the doc flow sheets  Baby awake and mom latched the baby independently with LATCH score = 10 . ( LS range 8-10 ) .  Mom is an experienced breast feeding mother.  This baby has been to the breast x 7 in his life of 9 hours , 3 wets and 5 stools, 1 spit.  Discussed nutritive vs non - nutritive feeding patterns , and to watch for hanging out latched.  Importance STS feedings.  Mother informed of post-discharge support and given phone number to the lactation department, including services for phone call assistance; out-patient appointments; and breastfeeding support group. List of other breastfeeding resources in the community given in the handout. Encouraged mother to call for problems or concerns related to breastfeeding.    Maternal Data Has patient been taught Hand Expression?: Yes Does the patient have breastfeeding experience prior to this delivery?: Yes  Feeding Feeding Type: Breast Fed  LATCH Score- ( Latch Score by [facility] / RN orientee)  Latch: Grasps breast easily, tongue down, lips flanged, rhythmical sucking.(mom declined pillow support and used fhe cradle position )  Audible Swallowing: Spontaneous and intermittent  Type of Nipple: Everted at rest and after stimulation  Comfort (Breast/Nipple): Soft / non-tender  Hold (Positioning): No assistance needed to correctly position infant at breast.  LATCH Score: 10  Interventions Interventions: Breast feeding basics reviewed(declined support / independent with latch )  Lactation Tools Discussed/Used     Consult Status Consult Status: Follow-up Date: 01/11/18 Follow-up type: In-patient    Matilde Sprang Zawadi Aplin 01/10/2018, 5:16 PM

## 2018-01-10 NOTE — Op Note (Signed)
Cesarean Section Procedure Note   Stephanie Harrington  01/10/2018  Indications: Scheduled Proceedure/Maternal Request 39 wks   Pre-operative Diagnosis: Previous Cesarean Section x 2.   Post-operative Diagnosis: Same   Surgeon: Shea Evans, MD  Assistants: Marlinda Mike, CNM  Anesthesia: spinal   Procedure Details:  The patient was seen in the Holding Room. The risks, benefits, complications, treatment options, and expected outcomes were discussed with the patient. The patient concurred with the proposed plan, giving informed consent. identified as Stephanie Harrington and the procedure verified as C-Section Delivery. A Time Out was held and the above information confirmed. 2 gm Ancef given. After induction of anesthesia, the patient was draped and prepped in the usual sterile manner, foley placed. A Pfannenstiel Incision was made and carried down through the subcutaneous tissue to the fascia. Fascial incision was made and extended transversely. The fascia was separated from the underlying rectus tissue superiorly and inferiorly. Dense adhesions between fascia and muscle noted requiring cautery dissection.  The peritoneum was identified and entered. Peritoneal incision was extended longitudinally. The utero-vesical peritoneal reflection was incised transversely and the bladder flap was bluntly freed from the lower uterine segment. Alexis retractor placed. A low transverse uterine incision was made. Clear amniotic fluid drained. Head was floating, it was brought to the incision but fundal pressure didn't achieve head delivery, so head delivery was assisted with Vacuum with one application and controlled flexion. Vacuum released and baby delivered. Baby boy was vigorous, born at 7.57 am, delayed cord clamping done at 1 minute and he was handed off toNICU team. Apgar scores of 9 at one minute and 9 at five minutes. Cord ph was not sent. Cord blood was obtained for evaluation. The placenta was removed Intact  and appeared normal. The uterine outline, tubes and ovaries appeared normal. The uterine incision was closed with running locked sutures of 0 Monocryl followed by another imbricating layer. Hemostasis was observed. Alexis removed. Peritoneal closure done with 2-0 Vicryl. Muscles approximated in midline in lower half. The fascia was then reapproximated with running sutures of 0Vicryl. The skin was closed with 4-0Vicryl. Sterile dressing and pressure dressing placed.   Instrument, sponge, and needle counts were correct prior the abdominal closure and were correct at the conclusion of the case.    Findings: BOY delivered via low transverse hysterotomy with help of one vacuum application for head delivery. Apgars 9, 9. Normal placenta, uterus, tubes and ovaries.    Estimated Blood Loss: 167 mL   Total IV Fluids: 2300 ml LR   Urine Output: 50 cc clear in foley  Specimens: cord blood   Complications: no complications  Disposition: PACU - hemodynamically stable.   Maternal Condition: stable   Baby condition / location:  Couplet care / Skin to Skin  Attending Attestation: I performed the procedure.   Signed: Surgeon(s): Shea Evans, MD

## 2018-01-10 NOTE — Anesthesia Preprocedure Evaluation (Addendum)
Anesthesia Evaluation  Patient identified by MRN, date of birth, ID band Patient awake    Reviewed: Allergy & Precautions, NPO status , Patient's Chart, lab work & pertinent test results  Airway Mallampati: II  TM Distance: >3 FB Neck ROM: Full    Dental no notable dental hx. (+) Teeth Intact, Dental Advisory Given   Pulmonary former smoker,    Pulmonary exam normal breath sounds clear to auscultation       Cardiovascular negative cardio ROS Normal cardiovascular exam Rhythm:Regular Rate:Normal     Neuro/Psych Depression negative neurological ROS     GI/Hepatic Neg liver ROS, GERD  ,  Endo/Other  negative endocrine ROS  Renal/GU negative Renal ROS  negative genitourinary   Musculoskeletal negative musculoskeletal ROS (+)   Abdominal   Peds  Hematology  (+) anemia ,   Anesthesia Other Findings Repeat C/S x2  Reproductive/Obstetrics (+) Pregnancy                            Anesthesia Physical Anesthesia Plan  ASA: II  Anesthesia Plan: Spinal   Post-op Pain Management:    Induction:   PONV Risk Score and Plan: 2 and Treatment may vary due to age or medical condition  Airway Management Planned: Natural Airway  Additional Equipment:   Intra-op Plan:   Post-operative Plan:   Informed Consent: I have reviewed the patients History and Physical, chart, labs and discussed the procedure including the risks, benefits and alternatives for the proposed anesthesia with the patient or authorized representative who has indicated his/her understanding and acceptance.   Dental advisory given  Plan Discussed with: CRNA  Anesthesia Plan Comments:         Anesthesia Quick Evaluation

## 2018-01-10 NOTE — Progress Notes (Signed)
MOB was referred for history of depression/anxiety. * Referral screened out by Clinical Social Worker because none of the following criteria appear to apply: ~ History of anxiety/depression during this pregnancy, or of post-partum depression following prior delivery. ~ Diagnosis of anxiety and/or depression within last 3 years OR * MOB's symptoms currently being treated with medication and/or therapy. Please contact the Clinical Social Worker if needs arise, by MOB request, or if MOB scores greater than 9/yes to question 10 on Edinburgh Postpartum Depression Screen.  Derel Mcglasson, LCSW Clinical Social Worker  [facility]  [phone]  

## 2018-01-10 NOTE — Anesthesia Procedure Notes (Deleted)
Spinal

## 2018-01-11 ENCOUNTER — Inpatient Hospital Stay (HOSPITAL_COMMUNITY)
Admission: AD | Admit: 2018-01-11 | Payer: BLUE CROSS/BLUE SHIELD | Source: Ambulatory Visit | Admitting: Obstetrics & Gynecology

## 2018-01-11 LAB — BIRTH TISSUE RECOVERY COLLECTION (PLACENTA DONATION)

## 2018-01-11 LAB — CBC
HCT: 33.6 % — ABNORMAL LOW (ref 36.0–46.0)
Hemoglobin: 11.5 g/dL — ABNORMAL LOW (ref 12.0–15.0)
MCH: 28.3 pg (ref 26.0–34.0)
MCHC: 34.2 g/dL (ref 30.0–36.0)
MCV: 82.8 fL (ref 80.0–100.0)
NRBC: 0 % (ref 0.0–0.2)
PLATELETS: 208 10*3/uL (ref 150–400)
RBC: 4.06 MIL/uL (ref 3.87–5.11)
RDW: 12.9 % (ref 11.5–15.5)
WBC: 10.7 10*3/uL — AB (ref 4.0–10.5)

## 2018-01-11 NOTE — Progress Notes (Signed)
Would like abdominal binder; pain controlled; no c/o tol po, voiding w/o difficulty; ambulating, +flatus  BP 123/81 (BP Location: Left Arm)   Pulse 66   Temp 98.1 F (36.7 C) (Oral)   Resp 18   LMP 04/12/2017   SpO2 98%   Breastfeeding? Unknown    Intake/Output Summary (Last 24 hours) at 01/11/2018 1334 Last data filed at 01/11/2018 0319 Gross per 24 hour  Intake -  Output 2225 ml  Net -2225 ml   A&ox3 rrr ctab Abd: soft, nt, nd; fundus firm and below umb; dressing c/d/i; +bs LE: no edma, nt bilat  CBC Latest Ref Rng & Units 01/11/2018 01/09/2018 12/15/2016  WBC 4.0 - 10.5 K/uL 10.7(H) 7.6 8.1  Hemoglobin 12.0 - 15.0 g/dL 11.5(L) 12.9 13.5  Hematocrit 36.0 - 46.0 % 33.6(L) 37.4 40.1  Platelets 150 - 400 K/uL 208 209 222   A/P: pod 1 s/p rltcs Doing well, contin care Boy- wants circ Mild acute anemia - iron rich foods pp

## 2018-01-11 NOTE — Lactation Note (Signed)
This note was copied from a baby's chart. Lactation Consultation Note  Patient Name: Boy Aliha Diedrich ZOXWR'U Date: 01/11/2018 Reason for consult: Follow-up assessment;Infant weight loss;Term(4% weight loss / planned circ early evening )  Baby is 12 hours old  LC reviewed and updated doc flow flow sheets per mom and dad.  Baby going for a circ as the chart was being updated.  Per mom feels breastfeeding is going well, alittle sore and hearing more swallows.  LC explained since she is and experienced breast feeding mother her volume could be greater and the let down quicker.  LC explained potential feeding behaviors after the circ/ and cluster feeding.     Maternal Data Does the patient have breastfeeding experience prior to this delivery?: Yes  Feeding Feeding Type: (baby recently fed )  LATCH Score                   Interventions Interventions: Breast feeding basics reviewed  Lactation Tools Discussed/Used     Consult Status Consult Status: Follow-up Date: 01/12/18 Follow-up type: In-patient    Matilde Sprang Tevis Conger 01/11/2018, 4:32 PM

## 2018-01-11 NOTE — Plan of Care (Signed)
Progressing appropriately. Encouraged to call for assistance as needed, and for LATCH assessment.  

## 2018-01-12 ENCOUNTER — Encounter (HOSPITAL_COMMUNITY): Payer: Self-pay | Admitting: Obstetrics & Gynecology

## 2018-01-12 MED ORDER — ACETAMINOPHEN 325 MG PO TABS: 650.0000 mg | ORAL_TABLET | ORAL | Status: DC | PRN

## 2018-01-12 MED ORDER — OXYCODONE HCL 10 MG PO TABS: 10.0000 mg | ORAL_TABLET | ORAL | 0 refills | Status: DC | PRN

## 2018-01-12 MED ORDER — SIMETHICONE 80 MG PO CHEW: 80.0000 mg | CHEWABLE_TABLET | ORAL | 0 refills | Status: DC | PRN

## 2018-01-12 MED ORDER — COCONUT OIL OIL: 1.0000 "application " | TOPICAL_OIL | 0 refills | Status: DC | PRN

## 2018-01-12 MED ORDER — SENNOSIDES-DOCUSATE SODIUM 8.6-50 MG PO TABS: 2.0000 | ORAL_TABLET | ORAL | Status: DC

## 2018-01-12 MED ORDER — IBUPROFEN 600 MG PO TABS: 600.0000 mg | ORAL_TABLET | Freq: Four times a day (QID) | ORAL | 0 refills | Status: DC

## 2018-01-12 NOTE — Progress Notes (Signed)
Subjective: POD# 2 Live born female  Birth Weight: 8 lb 3.6 oz (3730 g) APGAR: 9, 9  Newborn Delivery   Birth date/time:  01/10/2018 07:57:00 Delivery type:  C-Section, Low Transverse Trial of labor:  No C-section categorization:  Repeat     Delivering provider: MODY, VAISHALI   circumcision completed Baby name: Clark Feeding: breast  Pain control at delivery: Spinal   Reports feeling well, desires DC home. Feeding: breast Patient reports tolerating PO.  Breast symptoms: + colostrum Pain controlled with PO meds Denies HA/SOB/C/P/N/V/dizziness. Flatus present. She reports vaginal bleeding as normal, without clots.  She is ambulating, urinating without difficulty.     Objective:   VS:    Vitals:   01/11/18 1400 01/11/18 1419 01/11/18 2225 01/12/18 0523  BP: 119/84 119/84 126/82 138/89  Pulse: (!) 57 (!) 57 62 (!) 55  Resp: 18 18 18 18   Temp: (!) 97.5 F (36.4 C) (!) 97.5 F (36.4 C) 98.2 F (36.8 C) 97.6 F (36.4 C)  TempSrc: Oral Oral Oral Axillary  SpO2:        No intake or output data in the 24 hours ending 01/12/18 1106      Recent Labs    01/11/18 0553  WBC 10.7*  HGB 11.5*  HCT 33.6*  PLT 208     Blood type: --/--/A POS (10/23 1000)  Rubella: Immune (04/03 0000)     Physical Exam:  General: alert, cooperative and no distress Abdomen: soft, nontender, normal bowel sounds Incision: clean, dry and intact Uterine Fundus: firm, below umbilicus, nontender Lochia: minimal Ext: no edema, redness or tenderness in the calves or thighs      Assessment/Plan: 37 y.o.   POD# 2. [id number]                  Principal Problem:   R C/S 10/24 Active Problems:   Postpartum care following cesarean delivery (10/24)   Doing well, stable.    Routine post-op care             DC home today w/ instructions  F/U at [facility] in 6 weeks and PRN   Neta Mends, CNM, MSN 01/12/2018, 11:06 AM

## 2018-01-12 NOTE — Discharge Summary (Signed)
OB Discharge Summary  Patient Name: Stephanie Harrington DOB: November 26, 1980 MRN: [id number]  Date of admission: 01/10/2018 Delivering provider: MODY, VAISHALI   Date of discharge: 01/12/2018  Admitting diagnosis: Previous Cesarean Section x 2 Intrauterine pregnancy: [redacted]w[redacted]d     Secondary diagnosis:Principal Problem:   R C/S 10/24 Active Problems:   Postpartum care following cesarean delivery (10/24)  Discharge diagnosis:  Patient Active Problem List   Diagnosis Date Noted  . Postpartum care following cesarean delivery (10/24) 01/10/2018  . R C/S 10/24 01/10/2018  . Pill esophagitis due to doxycycline 12/29/2016     Pain control: Spinal  Complications: None  Hospital course:  Sceduled C/S   37 y.o. yo [id number] at 105w0d was admitted to the hospital 01/10/2018 for scheduled cesarean section with the following indication:Elective Repeat.  Membrane Rupture Time/Date: 7:56 AM ,01/10/2018   Patient delivered a Viable infant.01/10/2018  Details of operation can be found in separate operative note.  Pateint had an uncomplicated postpartum course.  She is ambulating, tolerating a regular diet, passing flatus, and urinating well. Patient is discharged home in stable condition on  01/12/18         Physical exam  Vitals:   01/11/18 1400 01/11/18 1419 01/11/18 2225 01/12/18 0523  BP: 119/84 119/84 126/82 138/89  Pulse: (!) 57 (!) 57 62 (!) 55  Resp: 18 18 18 18   Temp: (!) 97.5 F (36.4 C) (!) 97.5 F (36.4 C) 98.2 F (36.8 C) 97.6 F (36.4 C)  TempSrc: Oral Oral Oral Axillary  SpO2:       General: alert, cooperative and no distress Lochia: appropriate Uterine Fundus: firm Incision: Healing well with no significant drainage DVT Evaluation: No cords or calf tenderness. No significant calf/ankle edema. Labs: Lab Results  Component Value Date   WBC 10.7 (H) 01/11/2018   HGB 11.5 (L) 01/11/2018   HCT 33.6 (L) 01/11/2018   MCV 82.8 01/11/2018   PLT 208 01/11/2018   CMP Latest Ref Rng &  Units 12/15/2016  Glucose 65 - 99 mg/dL 85  BUN 6 - 20 mg/dL 9  Creatinine 8.46 - 9.62 mg/dL 9.52  Sodium 841 - 324 mmol/L 135  Potassium 3.5 - 5.1 mmol/L 3.2(L)  Chloride 101 - 111 mmol/L 102  CO2 22 - 32 mmol/L 24  Calcium 8.9 - 10.3 mg/dL 9.2    Discharge instruction: per After Visit Summary and "Baby and Me Booklet".  After Visit Meds:  Allergies as of 01/12/2018   No Known Allergies     Medication List    TAKE these medications   acetaminophen 325 MG tablet Commonly known as:  TYLENOL Take 2 tablets (650 mg total) by mouth every 4 (four) hours as needed (for pain scale < 4).   coconut oil Oil Apply 1 application topically as needed.   COQ10 PO Take 125 mg by mouth daily.   ibuprofen 600 MG tablet Commonly known as:  ADVIL,MOTRIN Take 1 tablet (600 mg total) by mouth every 6 (six) hours.   Oxycodone HCl 10 MG Tabs Take 1 tablet (10 mg total) by mouth every 4 (four) hours as needed (pain scale > 7).   pantoprazole 20 MG tablet Commonly known as:  PROTONIX Take 20 mg by mouth daily.   prenatal multivitamin Tabs tablet Take 1 tablet by mouth at bedtime.   senna-docusate 8.6-50 MG tablet Commonly known as:  Senokot-S Take 2 tablets by mouth daily. Start taking on:  01/13/2018   simethicone 80 MG chewable tablet Commonly known as:  MYLICON Chew 1 tablet (80 mg total) by mouth as needed for flatulence.       Diet: routine diet  Activity: Advance as tolerated. Pelvic rest for 6 weeks.   Postpartum contraception: Not Discussed  Newborn Data: Live born female  Birth Weight: 8 lb 3.6 oz (3730 g) APGAR: 9, 9  Newborn Delivery   Birth date/time:  01/10/2018 07:57:00 Delivery type:  C-Section, Low Transverse Trial of labor:  No C-section categorization:  Repeat     named Clark Baby Feeding: Breast Disposition:home with mother   Delivery Report:  Review the Delivery Report for details.    Follow up: Follow-up Information    Shea Evans, MD.  Schedule an appointment as soon as possible for a visit in 6 week(s).   Specialty:  Obstetrics and Gynecology Contact information: [address] [place] [address] [phone]             Signed: Cipriano Mile, MSN 01/12/2018, 11:17 AM

## 2018-01-12 NOTE — Lactation Note (Signed)
This note was copied from a baby's chart. Lactation Consultation Note  Patient Name: Stephanie Harrington ZOXWR'U Date: 01/12/2018 Reason for consult: Follow-up assessment;Infant weight loss  Baby is 48 hours , 7% weight loss,  LC reviewed and updated the doc flow sheets per dad.  Baby woke up at consult and LC assisted mom with the football  Position/ pillow support/ and obtaining the depth. Baby fed for 8 mins  With multiple swallows / increased with breast compressions.  Mom mentioned the latch felt a lot better and didn't seem so shallow.  LC recommended and showed mom and dad how to get baby to open wide prior  To latch to obtain the depth to enhance milk flow to baby.  Per mom nipples feel better today.  Sore nipple and engorgement prevention and tx reviewed.  Per mom had mastitis  with one of her babies. LC reviewed the S/S's.  LC instructed mom on the use of hand pump/ and provided #27 F for  when milk comes  In. LC highly recommended STS feedings until the baby can stay awake for a feeding.  Discussed nutritive vs non - nutritive feeding patterns/ and to watch for hanging out latched.  Due to weight loss - LC recommended - breast massage, hand express, pre-pump if needed  To prime the milk ducts/ firm support and have dad check the latch until the baby is consistent with depth.  Mother informed of post-discharge support and given phone number to the lactation department, including services for phone call assistance; out-patient appointments; and breastfeeding support group. List of other breastfeeding resources in the community given in the handout. Encouraged mother to call for problems or concerns related to breastfeeding.  Per mom also has a DEBP at home.    Maternal Data    Feeding Feeding Type: (recently fed )  LATCH Score Latch: Grasps breast easily, tongue down, lips flanged, rhythmical sucking.(Already latched when assessed.)  Audible Swallowing: A few with  stimulation  Type of Nipple: Everted at rest and after stimulation  Comfort (Breast/Nipple): Soft / non-tender  Hold (Positioning): No assistance needed to correctly position infant at breast.  LATCH Score: 9  Interventions Interventions: Breast feeding basics reviewed  Lactation Tools Discussed/Used Tools: Pump Breast pump type: Manual WIC Program: No Pump Review: Setup, frequency, and cleaning;Milk Storage Initiated by:: MAI  Date initiated:: 01/12/18   Consult Status Consult Status: Complete Date: 01/12/18    Stephanie Harrington 01/12/2018, 8:55 AM

## 2018-01-21 DIAGNOSIS — O135 Gestational [pregnancy-induced] hypertension without significant proteinuria, complicating the puerperium: Secondary | ICD-10-CM | POA: Diagnosis not present

## 2018-02-04 DIAGNOSIS — O135 Gestational [pregnancy-induced] hypertension without significant proteinuria, complicating the puerperium: Secondary | ICD-10-CM | POA: Diagnosis not present

## 2018-05-24 DIAGNOSIS — N941 Unspecified dyspareunia: Secondary | ICD-10-CM | POA: Diagnosis not present

## 2018-06-02 DIAGNOSIS — R6889 Other general symptoms and signs: Secondary | ICD-10-CM | POA: Diagnosis not present

## 2018-06-02 DIAGNOSIS — R509 Fever, unspecified: Secondary | ICD-10-CM | POA: Diagnosis not present

## 2018-06-25 DIAGNOSIS — R278 Other lack of coordination: Secondary | ICD-10-CM | POA: Diagnosis not present

## 2018-06-25 DIAGNOSIS — N9411 Superficial (introital) dyspareunia: Secondary | ICD-10-CM | POA: Diagnosis not present

## 2018-06-25 DIAGNOSIS — M62838 Other muscle spasm: Secondary | ICD-10-CM | POA: Diagnosis not present

## 2018-06-25 DIAGNOSIS — M6281 Muscle weakness (generalized): Secondary | ICD-10-CM | POA: Diagnosis not present

## 2018-07-02 DIAGNOSIS — M6281 Muscle weakness (generalized): Secondary | ICD-10-CM | POA: Diagnosis not present

## 2018-07-02 DIAGNOSIS — M62838 Other muscle spasm: Secondary | ICD-10-CM | POA: Diagnosis not present

## 2018-07-02 DIAGNOSIS — R278 Other lack of coordination: Secondary | ICD-10-CM | POA: Diagnosis not present

## 2018-07-02 DIAGNOSIS — N9411 Superficial (introital) dyspareunia: Secondary | ICD-10-CM | POA: Diagnosis not present

## 2018-07-09 DIAGNOSIS — M6281 Muscle weakness (generalized): Secondary | ICD-10-CM | POA: Diagnosis not present

## 2018-07-09 DIAGNOSIS — R278 Other lack of coordination: Secondary | ICD-10-CM | POA: Diagnosis not present

## 2018-07-09 DIAGNOSIS — M62838 Other muscle spasm: Secondary | ICD-10-CM | POA: Diagnosis not present

## 2018-07-09 DIAGNOSIS — N9411 Superficial (introital) dyspareunia: Secondary | ICD-10-CM | POA: Diagnosis not present

## 2018-07-23 DIAGNOSIS — M6281 Muscle weakness (generalized): Secondary | ICD-10-CM | POA: Diagnosis not present

## 2018-07-23 DIAGNOSIS — R278 Other lack of coordination: Secondary | ICD-10-CM | POA: Diagnosis not present

## 2018-07-23 DIAGNOSIS — N9411 Superficial (introital) dyspareunia: Secondary | ICD-10-CM | POA: Diagnosis not present

## 2018-07-23 DIAGNOSIS — M62838 Other muscle spasm: Secondary | ICD-10-CM | POA: Diagnosis not present

## 2018-08-06 DIAGNOSIS — N9411 Superficial (introital) dyspareunia: Secondary | ICD-10-CM | POA: Diagnosis not present

## 2018-08-06 DIAGNOSIS — M6281 Muscle weakness (generalized): Secondary | ICD-10-CM | POA: Diagnosis not present

## 2018-08-06 DIAGNOSIS — R278 Other lack of coordination: Secondary | ICD-10-CM | POA: Diagnosis not present

## 2018-08-06 DIAGNOSIS — L700 Acne vulgaris: Secondary | ICD-10-CM | POA: Diagnosis not present

## 2018-08-06 DIAGNOSIS — M62838 Other muscle spasm: Secondary | ICD-10-CM | POA: Diagnosis not present

## 2019-03-04 DIAGNOSIS — Z6821 Body mass index (BMI) 21.0-21.9, adult: Secondary | ICD-10-CM | POA: Diagnosis not present

## 2019-03-04 DIAGNOSIS — Z1151 Encounter for screening for human papillomavirus (HPV): Secondary | ICD-10-CM | POA: Diagnosis not present

## 2019-03-04 DIAGNOSIS — Z01419 Encounter for gynecological examination (general) (routine) without abnormal findings: Secondary | ICD-10-CM | POA: Diagnosis not present

## 2019-03-04 DIAGNOSIS — Z124 Encounter for screening for malignant neoplasm of cervix: Secondary | ICD-10-CM | POA: Diagnosis not present

## 2019-03-27 ENCOUNTER — Ambulatory Visit (INDEPENDENT_AMBULATORY_CARE_PROVIDER_SITE_OTHER): Payer: BC Managed Care – PPO | Admitting: Cardiology

## 2019-03-27 ENCOUNTER — Encounter: Payer: Self-pay | Admitting: Cardiology

## 2019-03-27 ENCOUNTER — Other Ambulatory Visit: Payer: Self-pay

## 2019-03-27 VITALS — BP 123/85 | HR 55 | Temp 97.3°F | Resp 16 | Ht 64.0 in | Wt 129.6 lb

## 2019-03-27 DIAGNOSIS — Z8241 Family history of sudden cardiac death: Secondary | ICD-10-CM

## 2019-03-27 DIAGNOSIS — R011 Cardiac murmur, unspecified: Secondary | ICD-10-CM | POA: Diagnosis not present

## 2019-03-27 DIAGNOSIS — Z8249 Family history of ischemic heart disease and other diseases of the circulatory system: Secondary | ICD-10-CM | POA: Diagnosis not present

## 2019-03-27 NOTE — Progress Notes (Signed)
    Patient referred by Georgianne Fick, MD for screening for cardiac disease  Subjective:   Stephanie Harrington, female    DOB: 06-27-1980, 39 y.o.   MRN: [id number]   Chief Complaint  Patient presents with  . New Patient (Initial Visit)    Family Hx of Sudden Cardiac Death     HPI  39 y.o. Caucasian female with family h/o sudden cardiac death.  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 She is physically active, runs and exercises regularly without any symptoms. Her mother died at age 83 of sudden death, had unclear cardiac illness. There is also family history of early CAD in maternal grandfather.   Current Outpatient Medications on File Prior to Visit  Medication Sig Dispense Refill  . acetaminophen (TYLENOL) 325 MG tablet Take 2 tablets (650 mg total) by mouth every 4 (four) hours as needed (for pain scale < 4).    . Coenzyme Q10 (COQ10 PO) Take 125 mg by mouth daily.     . Prenatal Vit-Fe Fumarate-FA (PRENATAL MULTIVITAMIN) TABS Take 1 tablet by mouth at bedtime.     No current facility-administered medications on file prior to visit.    Cardiovascular and other pertinent studies:  EKG 03/27/2019: Sinus bradycardia 50 bpm. Nonspecific ST-T changes.   Recent labs: Not available   Review of Systems  Cardiovascular: Negative for chest pain, dyspnea on exertion, leg swelling, palpitations and syncope.         Vitals:   03/27/19 1337  BP: 123/85  Pulse: (!) 55  Resp: 16  Temp: (!) 97.3 F (36.3 C)  SpO2: 100%     Body mass index is 22.25 kg/m. Filed Weights   03/27/19 1337  Weight: 129 lb 9.6 oz (58.8 kg)     Objective:   Physical Exam  Constitutional: She appears well-developed and well-nourished.  Neck: No JVD present.  Cardiovascular: Normal rate, regular rhythm, normal heart sounds and intact distal pulses.  No murmur heard. Pulmonary/Chest: Effort normal and breath sounds normal. She has no wheezes. She has no rales.    Musculoskeletal:        General: No edema.  Nursing note and vitals reviewed.       Assessment & Recommendations:   39 y.o. Caucasian female with family h/o sudden cardiac death.  While history is unclear re:her mother who had sudden cardiac death at 55, more relatives on maternal side have had CAD in 60s. Recommend calcium score, lipid panel, lipoprotein a for risk stratification. Given soft systolic murmur, recommend echocardiogram.  Further recommendations based on above testing.   Thank you for referring the patient to [place]. Please feel free to contact with any questions.  Elder Negus, MD [facility] Pager: [phone] Office: [phone]

## 2019-03-31 ENCOUNTER — Ambulatory Visit (INDEPENDENT_AMBULATORY_CARE_PROVIDER_SITE_OTHER): Payer: BC Managed Care – PPO

## 2019-03-31 ENCOUNTER — Other Ambulatory Visit: Payer: Self-pay

## 2019-03-31 DIAGNOSIS — Z8241 Family history of sudden cardiac death: Secondary | ICD-10-CM

## 2019-03-31 DIAGNOSIS — R011 Cardiac murmur, unspecified: Secondary | ICD-10-CM

## 2019-04-08 NOTE — Progress Notes (Signed)
LVM with details.

## 2019-11-07 IMAGING — US US MFM OB DETAIL+14 WK
1 series · 14 of 28 positions shown · non-contrast
Comparison: none

[Series 1: us mfm ob detail+14 wk · 102 acquisitions, 14 frames shown]
[im 4/102]
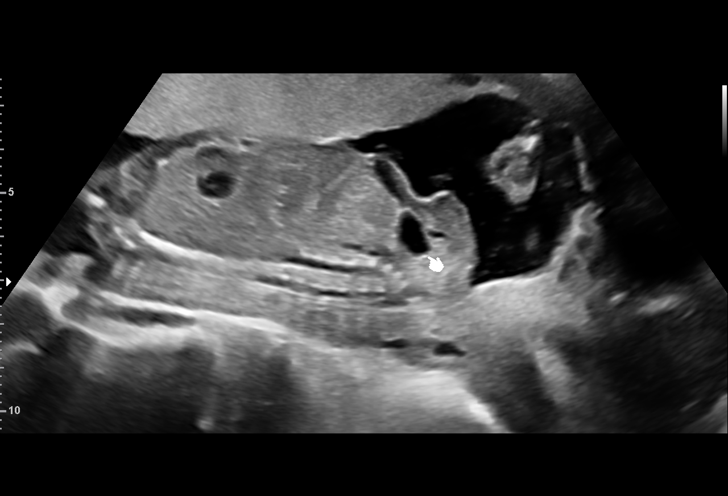
[im 12/102]
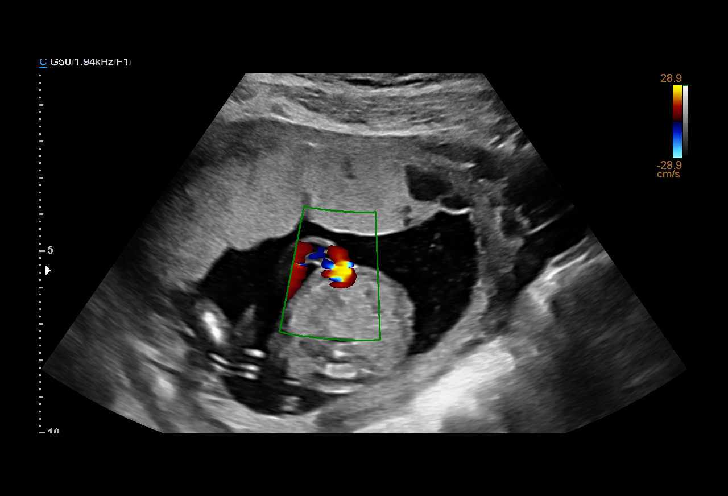
[im 19/102]
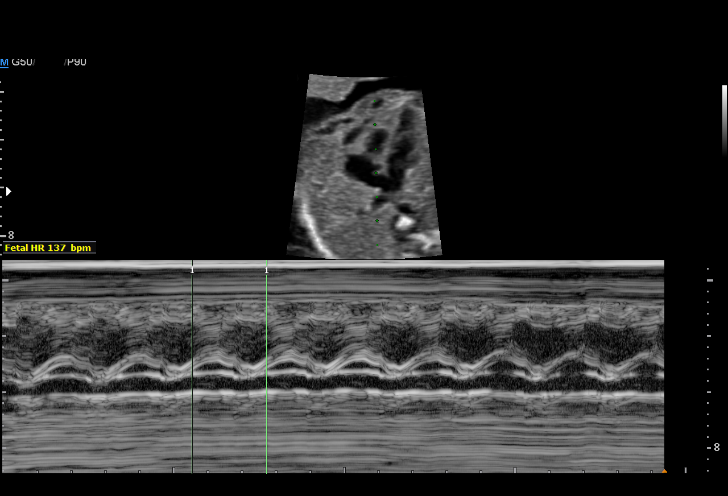
[im 27/102]
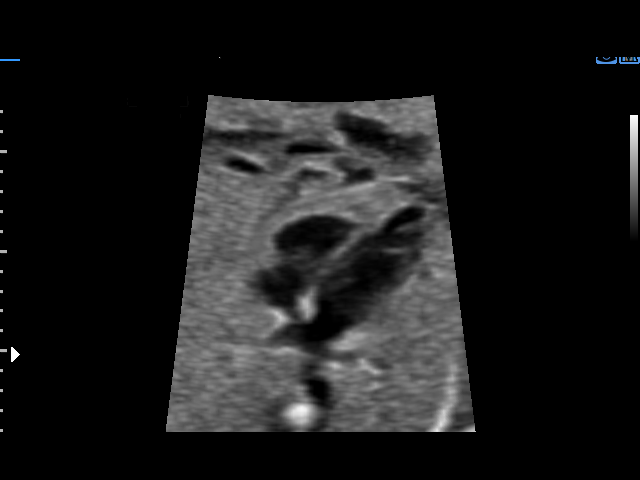
[im 34/102]
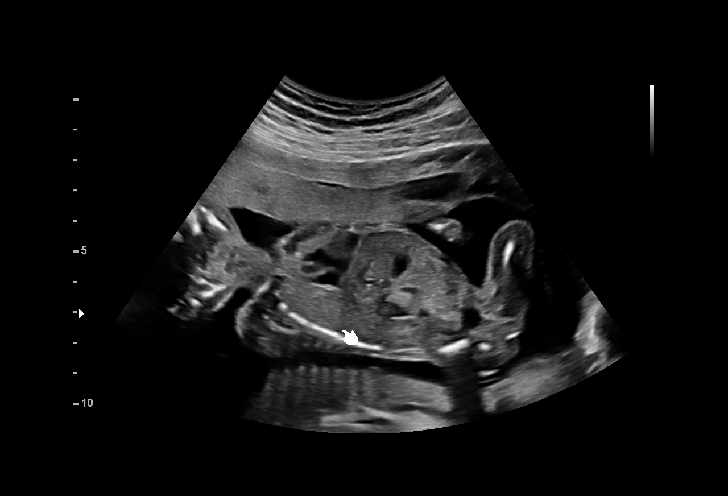
[im 42/102]
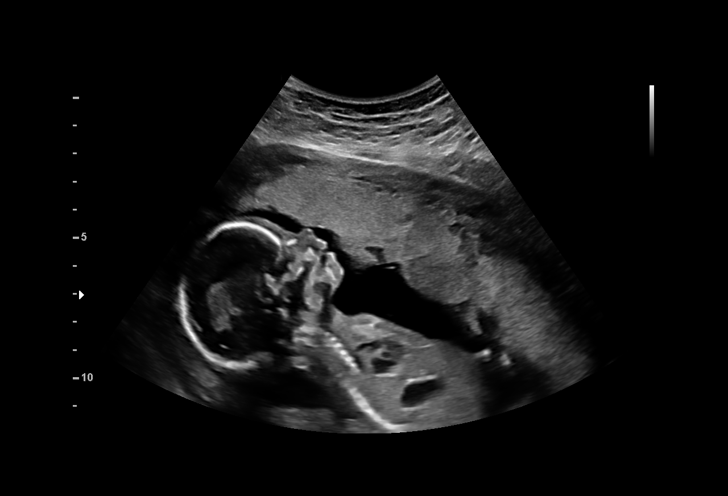
[im 49/102]
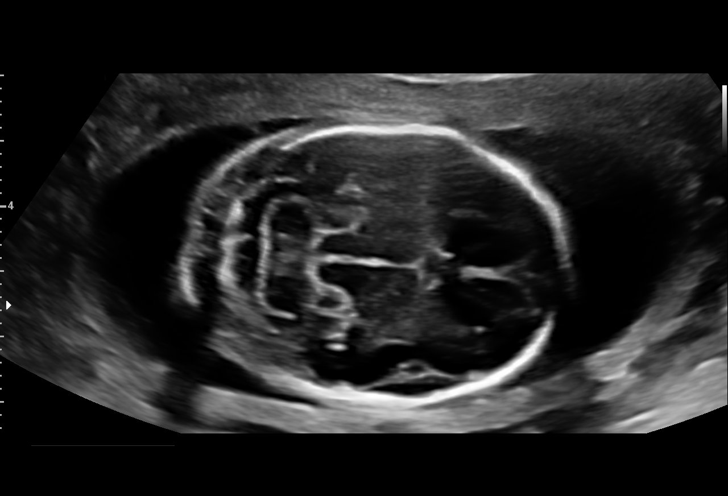
[im 57/102]
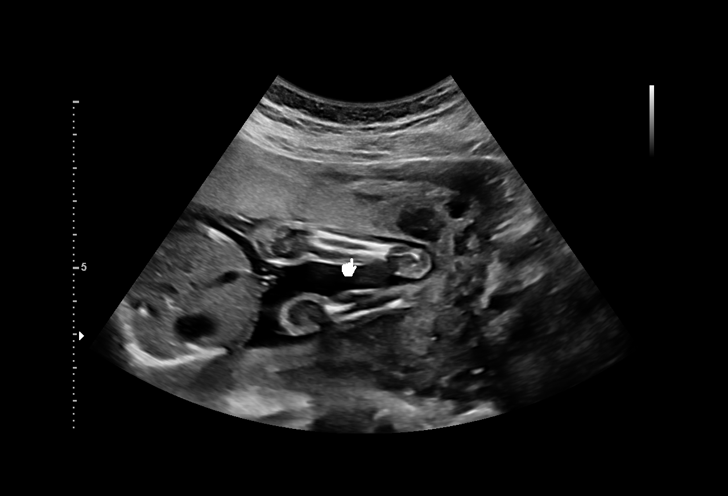
[im 64/102]
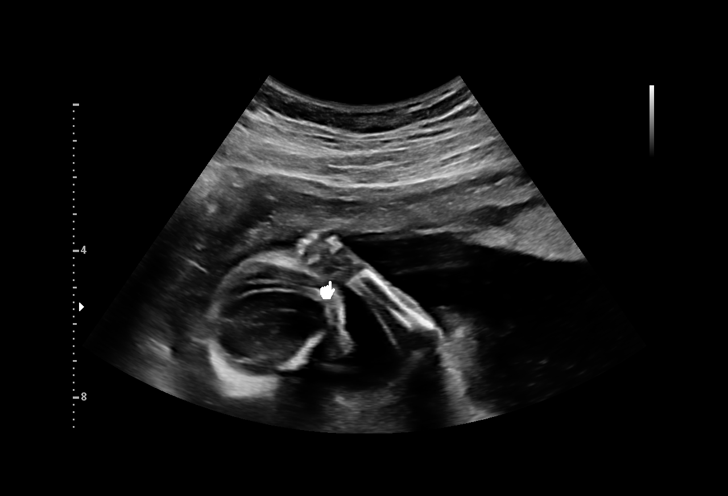
[im 72/102]
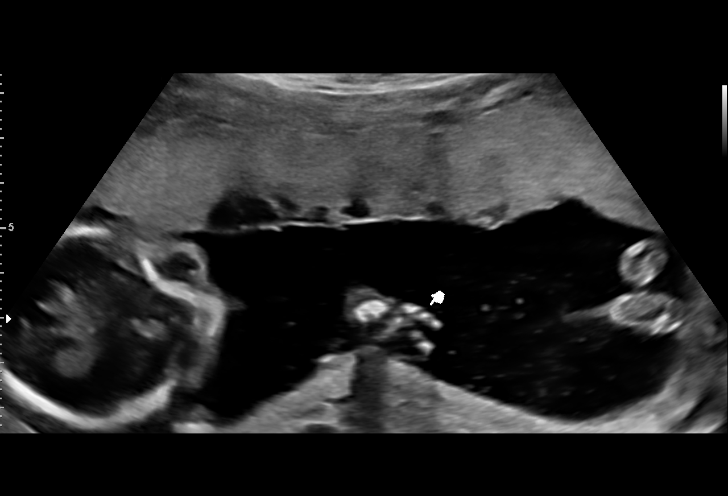
[im 79/102]
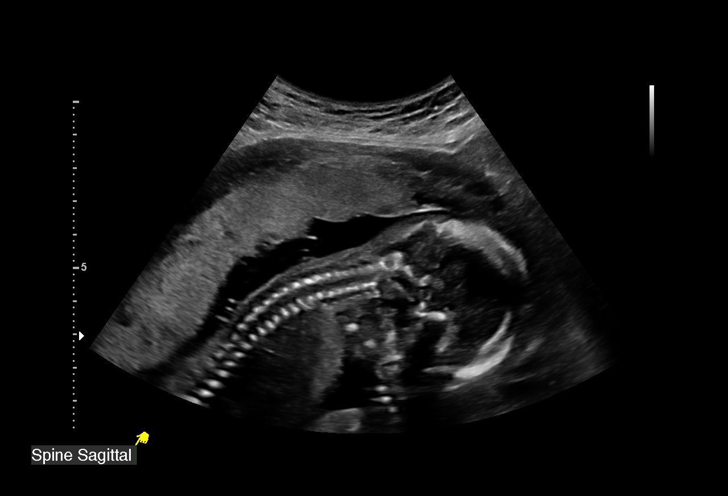
[im 87/102]
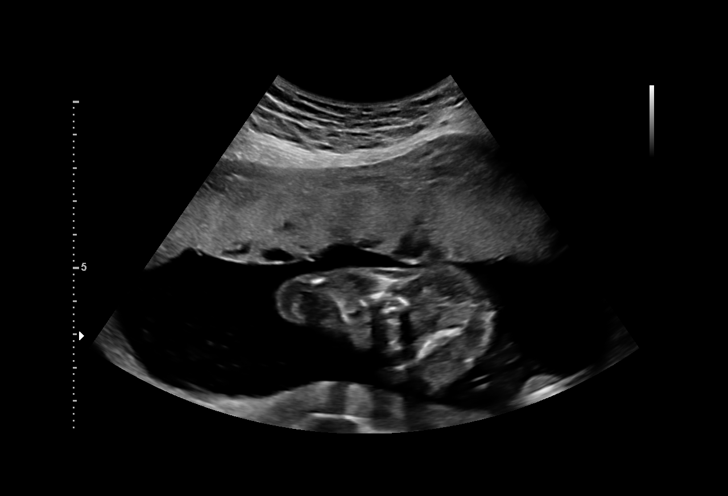
[im 94/102]
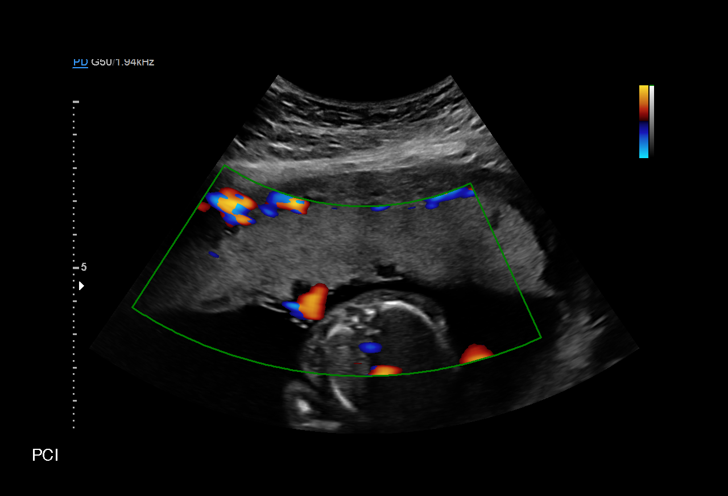
[im 102/102]
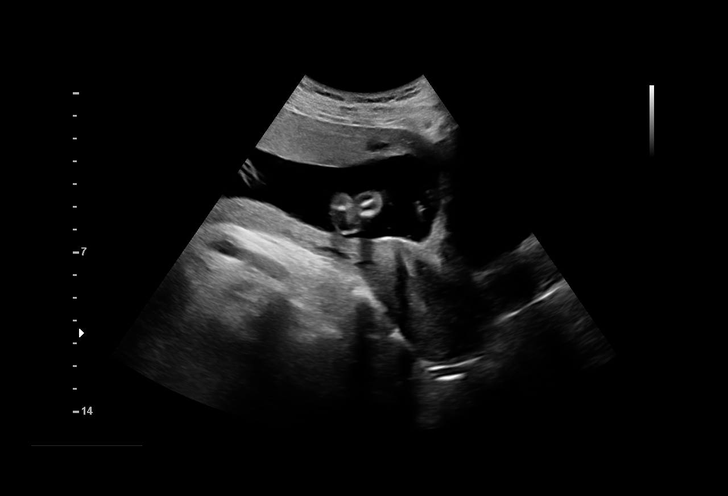

[14 of 28 positions shown; findings below may reference images not displayed]

1  PENG GARDNER              [id number]      [id number]     [id number]
Indications

17 weeks gestation of pregnancy
Advanced maternal age multigravida 35+,
second trimester; low risk NIPS
History of cesarean delivery, currently
pregnant (x2)
Encounter for antenatal screening for
malformations
Abnormal first trimester screen (NT) (5mm)
OB History

Blood Type:            Height:  5'4"   Weight (lb):  141       BMI:
Gravidity:    4         Term:   2         SAB:   1
Living:       2
Fetal Evaluation

Num Of Fetuses:     1
Fetal Heart         137
Rate(bpm):
Cardiac Activity:   Observed
Presentation:       Breech
Placenta:           Anterior, above cervical os
P. Cord Insertion:  Visualized

Amniotic Fluid
AFI FV:      Subjectively within normal limits

Largest Pocket(cm)
4.7
Biometry
BPD:      41.3  mm     G. Age:  18w 3d         78  %    CI:         70.05  %    70 - 86
FL/HC:       17.5  %    15.8 - 18
HC:      157.4  mm     G. Age:  18w 5d         78  %    HC/AC:       1.21       1.07 -
AC:      130.6  mm     G. Age:  18w 4d         72  %    FL/BPD:      66.8  %
FL:       27.6  mm     G. Age:  18w 3d         66  %    FL/AC:       21.1  %    20 - 24
HUM:      27.2  mm     G. Age:  18w 5d         79  %
CER:      18.9  mm     G. Age:  18w 3d         66  %
NFT:       5.5  mm

CM:        3.7  mm

Est. FW:     244   gm     0 lb 9 oz     61  %
Gestational Age

LMP:           17w 6d        Date:  04/12/17                 EDD:    01/17/18
U/S Today:     18w 4d                                        EDD:    01/12/18
Best:          17w 6d     Det. By:  LMP  (04/12/17)          EDD:    01/17/18
Anatomy

Cranium:               Appears normal         Aortic Arch:            Appears normal
Cavum:                 Appears normal         Ductal Arch:            Appears normal
Ventricles:            Appears normal         Diaphragm:              Appears normal
Choroid Plexus:        Appears normal         Stomach:                Appears normal, left
sided
Cerebellum:            Appears normal         Abdomen:                Appears normal
Posterior Fossa:       Appears normal         Abdominal Wall:         Appears nml (cord
insert, abd wall)
Nuchal Fold:           Appears normal         Cord Vessels:           Appears normal (3
vessel cord)
Face:                  Appears normal         Kidneys:                Appear normal
(orbits and profile)
Lips:                  Appears normal         Bladder:                Appears normal
Thoracic:              Appears normal         Spine:                  Appears normal
Heart:                 Appears normal         Upper Extremities:      Appears normal
(4CH, axis, and situs
RVOT:                  Appears normal         Lower Extremities:      Appears normal
LVOT:                  Appears normal

Other:  Fetus appears to be a male. Heels and 5th digit visualized. Open
hands visualized. Nasal bone visualized.
Cervix Uterus Adnexa

Cervix
Length:           4.57  cm.
Normal appearance by transabdominal scan.

Uterus
No abnormality visualized.

Left Ovary
Size(cm)       2.1  x   1.3    x  1.6       Vol(ml):
Within normal limits.

Right Ovary
Size(cm)       2.1  x    1     x  1.6       Vol(ml):
Within normal limits.
Impression

SIUP at 17+6 weeks
Normal detailed fetal anatomy
Markers of aneuploidy: none
Normal amniotic fluid volume
Measurements consistent with LMP dating
Recommendations

Fetal ECHO offered (they will check with insurance company)
Growth US at 28 and 34 weeks

## 2020-02-20 ENCOUNTER — Encounter: Payer: Self-pay | Admitting: Psychiatry

## 2020-02-20 ENCOUNTER — Ambulatory Visit (INDEPENDENT_AMBULATORY_CARE_PROVIDER_SITE_OTHER): Payer: BC Managed Care – PPO | Admitting: Psychiatry

## 2020-02-20 ENCOUNTER — Other Ambulatory Visit: Payer: Self-pay

## 2020-02-20 VITALS — BP 148/97 | HR 50 | Ht 65.0 in | Wt 130.0 lb

## 2020-02-20 DIAGNOSIS — F411 Generalized anxiety disorder: Secondary | ICD-10-CM | POA: Diagnosis not present

## 2020-02-20 MED ORDER — SERTRALINE HCL 50 MG PO TABS: ORAL_TABLET | ORAL | 1 refills | Status: DC

## 2020-02-20 NOTE — Progress Notes (Signed)
[facility] MD/PA/NP Initial Note  02/21/2020 6:14 PM Stephanie Harrington  MRN:  [id number]  Chief Complaint:  Chief Complaint    Anxiety      HPI: Pt is a 39 yo female being seen for initial evaluation for anxiety. She reports that she "definitely has anxiety."  She reports "always been a worrier." Had severe fear of needles and would pass out with blood draws. She has some health-related anxiety and will be concerned that certain s/s could be cancer or another serious health issue.   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 Reports that she plans for different scenarios and worst case scenarios. Had panic attacks when father was hospitalized in October with COVID. Denies any recent panic attacks. Will have chest tightness, shortness of breath with anxiety. She reports that she has higher BP with anxiety. Notices muscle tension with anxiety. Had stomach issues after her mother's death. She reports that she is hyper-aware of other people's feelings and emotions. She reports that she will try to please others and also worries abotu how she is being perceived. Will occ replay conversations. She reports that she tends to over-book herself and over-extend herself. Reports that she tends to stay busy and "don't rest well."   Has some intrusive memories and re-experiencing mother's death and other events from childhood. She reports that she has had long-standing nightmares and vivid dreams. Denies nightmares about past trauma. Had recurrent nightmare in childhood about being kidnapped. She reports some hyper-vigilance and occ exagerated startle response.   Reports feeling overwhelmed with clutter. Denies any checking behaviors. Occasional anxiety in crowds and this is related more to COVID.   Sadness "comes and goes." She reports periods of sadness are usually brief in duration and tend to correlate with menstrual cycle. Notices some irritability. Sleeps about 8 hours a night. Takes Melatonin as needed  for insomnia. Appetite is ok. Some feelings of guilt. She reports that her energy and motivation have been good. Concentration is usually adequate. Denies diminished interests. Occasionally wonders if her children would be better off if she was not in the picture. Denies SI.   Denies periods of decreased need for sleep. Denies risky or impulsive behavior. Denies any impulsivity. Denies periods of elevated mood.  Denies AH or VH. Denies paranoia.   Has 3 children. Oldest child is 75 yo and is about to be 12 yo. Middle child is 28 yo. Has a 2 yo son.   Born and raised in [place]. Has a sister that is 8 months older that is a PA. Mother died when pt was 72 yo and cause of death was undetermined. Mother had mental health issue, possibly Bipolar D/O. Father worked in [employer] and would be gone for long periods of time. Sometimes would go to grandmother's home and that she was "not the nicest person." [place]. Father was hospitalized for COVID in October. Reports that mother was in out of hospitalization. Completed college. Works as a part-time Administrator. Married x 12 years and husband is supportive. Mother-in-law and friends are supportive. Husband it one of 10 children and has large family. Enjoys running and reading.   Past Psychiatric Medication Trials: [place] Johns wort  Visit Diagnosis:    ICD-10-CM   1. Generalized anxiety disorder  F41.1 sertraline (ZOLOFT) 50 MG tablet    Past Psychiatric History: Seeing Francisca December for therapy. Saw a therapist after the birth of her daughter and may have had post-partum anxiety.  Denies any other past psychiatric treatment.   Past Medical History:  Past Medical History:  Diagnosis Date  . Anemia    childhood  . Depression   . GERD (gastroesophageal reflux disease)    no meds  . H/O varicella   . Hemorrhoid   . IBS (irritable bowel syndrome)   . Pill esophagitis due to doxycycline 12/29/2016  . Pneumonia     walking pneumonia in childhood  . Postpartum care following cesarean delivery (9/30) 12/17/2013  . Recurrent upper respiratory infection (URI)     Past Surgical History:  Procedure Laterality Date  . addenoidectomy    . CESAREAN SECTION  11/18/2011   Procedure: CESAREAN SECTION;  Surgeon: Robley FriesVaishali R Mody, MD;  Location: [facility];  Service: Gynecology;  Laterality: N/A;  Primary cesarean section of baby girl at 2013  APGAR 9/9  . CESAREAN SECTION N/A 12/17/2013   Procedure: Repeat CESAREAN SECTION;  Surgeon: Robley FriesVaishali R Mody, MD;  Location: [facility];  Service: Obstetrics;  Laterality: N/A;  EDD: 12/17/13  . CESAREAN SECTION N/A 01/10/2018   Procedure: Repeat CESAREAN SECTION;  Surgeon: Shea EvansMody, Vaishali, MD;  [facility];  Service: Obstetrics;  Laterality: N/A;  EDD: 01/15/18  Tracey RNFA  . COLONOSCOPY    . DILATION AND EVACUATION  12/19/2010   Procedure: DILATATION AND EVACUATION (D&E);  Surgeon: Robley FriesVaishali R Mody;  Location: [facility];  Service: Gynecology;  Laterality: N/A;  . TONSILLECTOMY    . UMBILICAL HERNIA REPAIR N/A 07/27/2015   Procedure:  REPAIR UMBILICAL HERNIA;  Surgeon: Harriette Bouillonhomas Cornett, MD;  Location: [facility];  Service: General;  Laterality: N/A;  . WISDOM TOOTH EXTRACTION     Family History:  Family History  Problem Relation Age of Onset  . Heart disease Mother   . Heart attack Mother   . Bipolar disorder Mother   . Anxiety disorder Mother   . Paranoid behavior Mother   . Hyperlipidemia Father   . Hypertension Father   . Cancer Father        prostate  . Lymphoma Father   . Cancer Maternal Grandmother        breast  . Heart disease Maternal Grandmother   . Coronary artery disease Maternal Grandmother   . Anxiety disorder Maternal Grandmother   . Anxiety disorder Daughter     Social History:  Social History   Socioeconomic History  . Marital status: Married    Spouse name: Arlys JohnBrian  . Number of children: 3  . Years of education: 3616  .  Highest education level: Not on file  Occupational History  . Occupation: Preschool Magazine features editorteacher    Employer: [employer]  Tobacco Use  . Smoking status: Former Smoker    Packs/day: 0.25    Years: 9.00    Pack years: 2.25    Types: Cigarettes    Quit date: 12/18/2009    Years since quitting: 10.1  . Smokeless tobacco: Never Used  Vaping Use  . Vaping Use: Never used  Substance and Sexual Activity  . Alcohol use: Yes    Comment: occasional  . Drug use: No  . Sexual activity: Yes    Partners: Male    Birth control/protection: None    Comment: 1st intercourse- [age], partners- 3, married- 10 yrs   Other Topics Concern  . Not on file  Social History Narrative   Married to [place], 2 children, preschool teacher [employer]   Social Determinants of [employer]   Financial Resource  Strain:   . Difficulty of Paying Living Expenses: Not on file  Food Insecurity:   . Worried About Programme researcher, broadcasting/film/video in the Last Year: Not on file  . Ran Out of Food in the Last Year: Not on file  Transportation Needs:   . Lack of Transportation (Medical): Not on file  . Lack of Transportation (Non-Medical): Not on file  Physical Activity:   . Days of Exercise per Week: Not on file  . Minutes of Exercise per Session: Not on file  Stress:   . Feeling of Stress : Not on file  Social Connections:   . Frequency of Communication with Friends and Family: Not on file  . Frequency of Social Gatherings with Friends and Family: Not on file  . Attends Religious Services: Not on file  . Active Member of Clubs or Organizations: Not on file  . Attends Banker Meetings: Not on file  . Marital Status: Not on file    Allergies: No Known Allergies  Metabolic Disorder Labs: No results found for: HGBA1C, MPG No results found for: PROLACTIN No results found for: CHOL, TRIG, HDL, CHOLHDL, VLDL, LDLCALC No results found for: TSH  Therapeutic Level Labs: No results found for: LITHIUM No results  found for: VALPROATE No components found for:  CBMZ  Current Medications: Current Outpatient Medications  Medication Sig Dispense Refill  . Coenzyme Q10 (COQ10 PO) Take 125 mg by mouth daily.     Marland Kitchen MAGNESIUM PO Take by mouth.    . MELATONIN GUMMIES PO Take by mouth.    . Multiple Vitamins-Minerals (WOMENS MULTI PO) Take by mouth.    . Omega-3 Fatty Acids (FISH OIL) 1000 MG CAPS Take by mouth.    . ST JOHNS WORT PO Take by mouth.    Marland Kitchen acetaminophen (TYLENOL) 325 MG tablet Take 2 tablets (650 mg total) by mouth every 4 (four) hours as needed (for pain scale < 4).    . sertraline (ZOLOFT) 50 MG tablet Take 1/2 tab po qd for one week, then increase to 1 tablet daily 60 tablet 1   No current facility-administered medications for this visit.    Medication Side Effects: N/A  Orders placed this visit:  No orders of the defined types were placed in this encounter.   Psychiatric Specialty Exam:  Review of Systems  Constitutional: Negative.   HENT: Negative.   Eyes: Negative.   Respiratory: Negative.   Cardiovascular: Negative.   Gastrointestinal: Negative.   Endocrine: Negative.   Genitourinary: Negative.   Musculoskeletal: Negative.   Skin: Negative.        Itching  Allergic/Immunologic: Negative.   Neurological: Negative.   Hematological: Negative.   Psychiatric/Behavioral:       Please refer to HPI    Blood pressure (!) 148/97, pulse (!) 50, height 5\' 5"  (1.651 m), weight 130 lb (59 kg), unknown if currently breastfeeding.Body mass index is 21.63 kg/m.  General Appearance: Casual, Neat and Well Groomed  Eye Contact:  Good  Speech:  Clear and Coherent and Normal Rate  Volume:  Normal  Mood:  Anxious  Affect:  Appropriate, Congruent and Anxious  Thought Process:  Coherent, Goal Directed, Linear and Descriptions of Associations: Intact  Orientation:  Full (Time, Place, and Person)  Thought Content: Logical, Hallucinations: None and Rumination   Suicidal Thoughts:  No   Homicidal Thoughts:  No  Memory:  WNL  Judgement:  Good  Insight:  Good  Psychomotor Activity:  Normal  Concentration:  Concentration: Good and Attention Span: Good  Recall:  Good  Fund of Knowledge: Good  Language: Good  Assets:  Communication Skills Desire for Improvement Resilience Social Support  ADL's:  Intact  Cognition: WNL  Prognosis:  Good   Receiving Psychotherapy: Yes   Treatment Plan/Recommendations: Pt seen for 60 minutes and time spent counseling pt regarding anxiety s/s and possible treatment options. Reviewed SSRI's to include proposed mechanism of action and possible response. Discussed potential benefits, risks, and side effects of Sertraline. Discussed starting with low dose and increasing dose based upon response and tolerability. Pt agrees to trial of Sertraline. Will start Sertraline 25 mg po qd for one week, then increase to 50 mg daily for anxiety. Also discussed potential benefits of EMDR to help process anxiety producing childhood experiences that may be contributing to his anxiety. Pt to follow-up with this provider in 1-2 months or sooner if clinically indicated. Patient advised to contact office with any questions, adverse effects, or acute worsening in signs and symptoms.      [name], PMHNP

## 2020-03-30 ENCOUNTER — Encounter: Payer: Self-pay | Admitting: Psychiatry

## 2020-03-30 ENCOUNTER — Ambulatory Visit (INDEPENDENT_AMBULATORY_CARE_PROVIDER_SITE_OTHER): Payer: BC Managed Care – PPO | Admitting: Psychiatry

## 2020-03-30 ENCOUNTER — Other Ambulatory Visit: Payer: Self-pay

## 2020-03-30 DIAGNOSIS — F411 Generalized anxiety disorder: Secondary | ICD-10-CM

## 2020-03-30 NOTE — Progress Notes (Signed)
[facility] Counselor Initial Adult Exam  Name: Stephanie Harrington Date: 03/30/2020 MRN: [id number] DOB: [birth year] PCP: Merrilee Seashore, MD  Time spent: 53 minutes start time 2:05 PM end time 2:58 PM   Guardian/Payee:  Patient    Paperwork requested:  Yes   Reason for Visit /Presenting Problem: Patient was present for session.  She shared that she was seeing someone on as on needed basis.  Met with Thayer Headings PMHNP and she suggested EMDR for patient.Patient has 3 children that are 2 6 and 8.  She shared that her mother suffered from Mental illness her mother died suddenly when patient was 75 it was ruled inconclusive probably a heart issues.  Patient shared she has a thought that she had done it herself. As her children get closer to that age she is concerned she wants to be the best she can be.  Father never remarried.  He was very sick with COVID in October and was in the hospital for 20 days and she started having panic attacks.  He currently has lymphoma. He is doing okay overall currently.  When mom was in and out of the hospital grandmother would step in and she wasn't a kind to them.  She was sick and she and her sister had to step in to help and she passed in 06-May-2018.  Lots of conflict with her uncle and they are no longer speaking to him. Father was in Event organiser. Dad and sister knew that uncle was not a good person but they bury things.  Grandfather died in May 06, 2017 he was there for her but difficult.  Grandmother chose patient's older sister over her and would say she was the [place] and patient was the [place]. Grandmother was close to patient's children. Patient's grandfather wasn't her biological grandfather and mother didn't find out until she was in her 51's. She shared that mother was treated differently than her brother.  When patient's mother died she didn't talk about it. It was a hard time of her being in and out of the hospital that her death seemed to be a relief.  Mother was IVCed  multiple times and patient would have to go see her and it was hard for her. Medication made her much worse.  She was a great mother prior to patient being 27.  Father had a special pager that they were to text if mother ever took them somewhere that he didn't know about.  He was in the SBI. She has stress at work but she is managing it with perspective okay. Daughter has been dx with GAD.  Her 1 year old is gifted and not being challenged at school and is melting down at home.  She has a 78 year old son as well. Her husband has her take the lead in parenting so she has to handle everything for them. Patient explained she Struggles with knowing  when to push for things for them and when to back off and she wants to work on that because she wants to be the best for them.  Patient is to think about goals for treatment to be discussed at next session when treatment plan will be developed  Mental Status Exam:   Appearance:   Well Groomed     Behavior:  Appropriate  Motor:  Normal  Speech/Language:   Normal Rate  Affect:  Appropriate  Mood:  anxious  Thought process:  normal  Thought content:    WNL  Sensory/Perceptual disturbances:  WNL  Orientation:  oriented to person, place, time/date and situation  Attention:  Good  Concentration:  Good  Memory:  WNL  Fund of knowledge:   Good  Insight:    Good  Judgment:   Good  Impulse Control:  Good   Reported Symptoms:  Anxiety, panic attack  Risk Assessment: Danger to Self:  No Self-injurious Behavior: No Danger to Others: No Duty to Warn:no Physical Aggression / Violence:No  Access to Firearms a concern: No  Gang Involvement:No  Patient / guardian was educated about steps to take if suicide or homicide risk level increases between visits: yes While future psychiatric events cannot be accurately predicted, the patient does not currently require acute inpatient psychiatric care and does not currently meet [facility] involuntary commitment  criteria.  Substance Abuse History: Current substance abuse: No     Past Psychiatric History:   Previous psychological history is significant for anxiety Outpatient Providers:none History of Psych Hospitalization: No  Psychological Testing: none   Abuse History: Victim of No., none   Report needed: No. Victim of Neglect:No. Perpetrator of none  Witness / Exposure to Domestic Violence: No   Protective Services Involvement: No  Witness to [employer] Violence:  No   Family History:  Family History  Problem Relation Age of Onset  . Heart disease Mother   . Heart attack Mother   . Bipolar disorder Mother   . Anxiety disorder Mother   . Paranoid behavior Mother   . Hyperlipidemia Father   . Hypertension Father   . Cancer Father        prostate  . Lymphoma Father   . Cancer Maternal Grandmother        breast  . Heart disease Maternal Grandmother   . Coronary artery disease Maternal Grandmother   . Anxiety disorder Maternal Grandmother   . Anxiety disorder Daughter     Living situation: the patient lives with their family  Sexual Orientation:  Straight  Relationship Status: married 12 years Name of spouse / other: Aaron Edelman              If a parent, number of children / ages:Reese 8, Josie 6, [place] 2  Support Systems; friends sister  Museum/gallery curator Stress:  No   Income/Employment/Disability: Employment  Armed forces logistics/support/administrative officer: No   Educational History: Education: college graduate  Religion/Sprituality/World View:   Christian  Any cultural differences that may affect / interfere with treatment:  not applicable   Recreation/Hobbies: running, reading, art  Stressors:Other: parenting, traumatic events  Strengths:  Supportive Relationships  Barriers:  none   Legal History: Pending legal issue / charges: The patient has no significant history of legal issues. History of legal issue / charges: none  Medical History/Surgical History:reviewed Past Medical History:   Diagnosis Date  . Anemia    childhood  . Depression   . GERD (gastroesophageal reflux disease)    no meds  . H/O varicella   . Hemorrhoid   . IBS (irritable bowel syndrome)   . Pill esophagitis due to doxycycline 12/29/2016  . Pneumonia    walking pneumonia in childhood  . Postpartum care following cesarean delivery (9/30) 12/17/2013  . Recurrent upper respiratory infection (URI)     Past Surgical History:  Procedure Laterality Date  . addenoidectomy    . CESAREAN SECTION  11/18/2011   Procedure: CESAREAN SECTION;  Surgeon: Elveria Royals, MD;  Location: [facility];  Service: Gynecology;  Laterality: N/A;  Primary cesarean section of baby girl at 2013  APGAR 9/9  . CESAREAN SECTION N/A 12/17/2013   Procedure: Repeat CESAREAN SECTION;  Surgeon: Elveria Royals, MD;  Location: [facility];  Service: Obstetrics;  Laterality: N/A;  EDD: 12/17/13  . CESAREAN SECTION N/A 01/10/2018   Procedure: Repeat CESAREAN SECTION;  Surgeon: Azucena Fallen, MD;  Location: [facility];  Service: Obstetrics;  Laterality: N/A;  EDD: 01/15/18  Tracey RNFA  . COLONOSCOPY    . DILATION AND EVACUATION  12/19/2010   Procedure: DILATATION AND EVACUATION (D&E);  Surgeon: Elveria Royals;  Location: [facility];  Service: Gynecology;  Laterality: N/A;  . TONSILLECTOMY    . UMBILICAL HERNIA REPAIR N/A 07/27/2015   Procedure:  REPAIR UMBILICAL HERNIA;  Surgeon: Erroll Luna, MD;  Location: [facility];  Service: General;  Laterality: N/A;  . WISDOM TOOTH EXTRACTION      Medications: Current Outpatient Medications  Medication Sig Dispense Refill  . acetaminophen (TYLENOL) 325 MG tablet Take 2 tablets (650 mg total) by mouth every 4 (four) hours as needed (for pain scale < 4).    . Coenzyme Q10 (COQ10 PO) Take 125 mg by mouth daily.     Marland Kitchen MAGNESIUM PO Take by mouth.    . MELATONIN GUMMIES PO Take by mouth.    . Multiple Vitamins-Minerals (WOMENS MULTI PO) Take by mouth.    . Omega-3 Fatty Acids (FISH  OIL) 1000 MG CAPS Take by mouth.    . sertraline (ZOLOFT) 50 MG tablet Take 1/2 tab po qd for one week, then increase to 1 tablet daily 60 tablet 1  . ST JOHNS WORT PO Take by mouth.     No current facility-administered medications for this visit.    No Known Allergies  Diagnoses:    ICD-10-CM   1. Generalized anxiety disorder  F41.1     Plan of Care: Patient is to set goals and treatment plan at next session.   Lina Sayre, [facility]

## 2020-04-02 ENCOUNTER — Ambulatory Visit: Payer: BC Managed Care – PPO | Admitting: Psychiatry

## 2020-04-07 ENCOUNTER — Ambulatory Visit: Payer: BC Managed Care – PPO | Admitting: Psychiatry

## 2020-04-30 ENCOUNTER — Ambulatory Visit (INDEPENDENT_AMBULATORY_CARE_PROVIDER_SITE_OTHER): Payer: BC Managed Care – PPO | Admitting: Psychiatry

## 2020-04-30 ENCOUNTER — Other Ambulatory Visit: Payer: Self-pay

## 2020-04-30 DIAGNOSIS — F411 Generalized anxiety disorder: Secondary | ICD-10-CM | POA: Diagnosis not present

## 2020-04-30 NOTE — Progress Notes (Signed)
      [facility] Counselor/Therapist Progress Note  Patient ID: Stephanie Harrington, MRN: [id number],    Date: 04/30/2020  Time Spent: 51 minutes start time 10:03 AM and time 10:54 AM  Treatment Type: Individual Therapy  Reported Symptoms: anxiety, overwhelmed  Mental Status Exam:  Appearance:   Well Groomed     Behavior:  Appropriate  Motor:  Normal  Speech/Language:   Normal Rate  Affect:  Appropriate  Mood:  anxious  Thought process:  normal  Thought content:    WNL  Sensory/Perceptual disturbances:    WNL  Orientation:  oriented to person, place, time/date and situation  Attention:  Good  Concentration:  Good  Memory:  WNL  Fund of knowledge:   Good  Insight:    Good  Judgment:   Good  Impulse Control:  Good   Risk Assessment: Danger to Self:  No Self-injurious Behavior: No Danger to Others: No Duty to Warn:no Physical Aggression / Violence:No  Access to Firearms a concern: No  Gang Involvement:No   Subjective: Patient was present for session. She shared that things are still very difficult with her children.  At this time they are not wanting to go to school and that is creating lots of issues for patient.  Patient shared that it is very difficult on she and her husband and they are trying to figure out how to help their children as much as possible.  Encouraged patient to continue working on living her kids the best way she can and setting limits with behaviors that are not appropriate.  Taught patient grounded and 5 exercise and ways that she can utilize that with her children as well.  Developed treatment plan and set goals in session could not sign due to COVID.  Patient discussed more traumas from childhood and agreed EMDR would be started at next session.  Interventions: Solution-Oriented/Positive Psychology  Diagnosis:   ICD-10-CM   1. Generalized anxiety disorder  F41.1     Plan: Patient is to use coping skills to decrease anxiety symptoms.  Patient is to  continue working with husband on ways that they can love and set appropriate limits with her children.  Patient is to journal when possible.  Patient is to take medication as directed. Long-term goal: Resolve the core conflict that is the source of anxiety Short-term goal: Identify the major life complex from the past and present the form the basis for present anxiety  Stevphen Meuse, [facility]

## 2020-05-14 ENCOUNTER — Ambulatory Visit: Payer: BC Managed Care – PPO | Admitting: Psychiatry

## 2020-05-26 ENCOUNTER — Other Ambulatory Visit: Payer: Self-pay

## 2020-05-26 ENCOUNTER — Ambulatory Visit (INDEPENDENT_AMBULATORY_CARE_PROVIDER_SITE_OTHER): Payer: BC Managed Care – PPO | Admitting: Psychiatry

## 2020-05-26 DIAGNOSIS — F411 Generalized anxiety disorder: Secondary | ICD-10-CM

## 2020-05-26 NOTE — Progress Notes (Signed)
      [facility] Counselor/Therapist Progress Note  Patient ID: Stephanie Harrington, MRN: [id number],    Date: 05/26/2020  Time Spent: 47 minutes start time 10:04 AM end time 10:51 AM  Treatment Type: Individual Therapy  Reported Symptoms: anxiety, sadness, triggered responses  Mental Status Exam:  Appearance:   Well Groomed     Behavior:  Appropriate  Motor:  Normal  Speech/Language:   Normal Rate  Affect:  Appropriate  Mood:  normal  Thought process:  normal  Thought content:    WNL  Sensory/Perceptual disturbances:    WNL  Orientation:  oriented to person, place and time/date  Attention:  Good  Concentration:  Good  Memory:  WNL  Fund of knowledge:   Good  Insight:    Good  Judgment:   Good  Impulse Control:  Good   Risk Assessment: Danger to Self:  No Self-injurious Behavior: No Danger to Others: No Duty to Warn:no Physical Aggression / Violence:No  Access to Firearms a concern: No  Gang Involvement:No   Subjective: Patient was present for session.  She shared that she is doing better.  She shared that things are going better with her daughter.  She explained that her father had COVID again and was in the hospital and her sister just put it all on her. She had to go back and forth to [place] to help him get through it.  He is getting his strength back.  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  Patient reported the biggest trigger recently has been her daughter Lianne Cure.  Did EMDR on Josie being rude and disrespectful, suds level 8, negative cognition "I am a bad mom" felt sadness in her chest.  Patient was able to reduce suds level to 3.  She was able to recognize that she does a good job as a mom but has a need for things to be perfect which is unrealistic.  Patient was encouraged to remind herself that it is normal for her daughter to challenge her and that she can handle it  appropriately.  Patient is also to journal through the week what ever surfaces to see where her EMDR needs to start at next session.  Interventions: Solution-Oriented/Positive Psychology and Eye Movement Desensitization and Reprocessing (EMDR)  Diagnosis:   ICD-10-CM   1. Generalized anxiety disorder  F41.1     Plan: Patient is to use CBT and coping skills to decrease anxiety symptoms.  Patient is to journal what surfaces between sessions to resolve at next session.  Patient is to remind herself that things should not be perfect and that is okay.   Long-term goal: Resolve the core conflict that is a source of anxiety Short-term goal: Identify the major life complex from the past and present that form the basis for present anxiety.  Stevphen Meuse, [facility]

## 2020-05-28 ENCOUNTER — Ambulatory Visit: Payer: BC Managed Care – PPO | Admitting: Psychiatry

## 2020-06-11 ENCOUNTER — Ambulatory Visit (INDEPENDENT_AMBULATORY_CARE_PROVIDER_SITE_OTHER): Payer: BC Managed Care – PPO | Admitting: Psychiatry

## 2020-06-11 ENCOUNTER — Other Ambulatory Visit: Payer: Self-pay

## 2020-06-11 DIAGNOSIS — F411 Generalized anxiety disorder: Secondary | ICD-10-CM | POA: Diagnosis not present

## 2020-06-11 NOTE — Progress Notes (Signed)
      [facility] Counselor/Therapist Progress Note  Patient ID: Stephanie Harrington, MRN: [id number],    Date: 06/11/2020  Time Spent: 52 minutes start time 11:11 AM end time 12:03 PM  Treatment Type: Individual Therapy  Reported Symptoms: anxiety, frustration, sadness, triggered responses  Mental Status Exam:  Appearance:   Well Groomed     Behavior:  Appropriate  Motor:  Normal  Speech/Language:   Normal Rate  Affect:  Appropriate  Mood:  anxious  Thought process:  normal  Thought content:    WNL  Sensory/Perceptual disturbances:    WNL  Orientation:  oriented to person, place, time/date and situation  Attention:  Good  Concentration:  Good  Memory:  WNL  Fund of knowledge:   Good  Insight:    Good  Judgment:   Good  Impulse Control:  Good   Risk Assessment: Danger to Self:  No Self-injurious Behavior: No Danger to Others: No Duty to Warn:no Physical Aggression / Violence:No  Access to Firearms a concern: No  Gang Involvement:No   Subjective: Patient was present for session.  She shared she is having lots of anxiety due to her her daughter being at the doctors office at time of session.  She shared that her husband was out of town last week and her dad was in [place] and got very sick so she had to go to go the hospital.  She shared that her sister hung up on her over the situation.  She went on to share that her daughters visited the new school and it went well.  She is now trying to figure out what to do over the next week.  She went on to share her body shut down on Monday and she had a stomach bug.  Her dad is still in the hospital and they are trying to figure out what to do. Patient did EMDR set on her sister hanging up on her, SUDS level 9, negative cognition "my needs don't matter", felt sadness in her chest.  Patient was able to reduce SUDS level to 1.  Patient was able to think of ways that she can handle things differently with her father and sister.  She reported  that at this time she plans on visiting her father later in the afternoon.    Interventions: Solution-Oriented/Positive Psychology and Eye Movement Desensitization and Reprocessing (EMDR)  Diagnosis:   ICD-10-CM   1. Generalized anxiety disorder  F41.1     Plan: Patient is to use CBT and coping skills to decrease anxiety symptoms.  Patient is to work on limit setting with her family.  Patient is to remind herself that her needs better and she is enough.  Patient is to take medication as directed. Long-term goal: Resolve the core conflict that is the source of anxiety Short-term goal: Identify the major life conflicts in the past and present the form the basis for present anxiety  Stevphen Meuse, [facility]

## 2020-06-16 ENCOUNTER — Other Ambulatory Visit: Payer: Self-pay | Admitting: Psychiatry

## 2020-06-16 DIAGNOSIS — F411 Generalized anxiety disorder: Secondary | ICD-10-CM

## 2020-06-16 NOTE — Telephone Encounter (Signed)
Stephanie Harrington, can you check with her on her dose?

## 2020-06-16 NOTE — Telephone Encounter (Signed)
She is taking 50 mg 1 tab daily and its working well for her

## 2020-06-23 ENCOUNTER — Other Ambulatory Visit: Payer: Self-pay

## 2020-06-23 ENCOUNTER — Ambulatory Visit (INDEPENDENT_AMBULATORY_CARE_PROVIDER_SITE_OTHER): Payer: BC Managed Care – PPO | Admitting: Psychiatry

## 2020-06-23 DIAGNOSIS — F411 Generalized anxiety disorder: Secondary | ICD-10-CM | POA: Diagnosis not present

## 2020-06-23 NOTE — Progress Notes (Signed)
      [facility] Counselor/Therapist Progress Note  Patient ID: Stephanie Harrington, MRN: [id number],    Date: 06/23/2020  Time Spent: 50 minutes start time 12:10 PM end time 1 PM  Treatment Type: Individual Therapy  Reported Symptoms: anxiety, nightmares, triggered responses  Mental Status Exam:  Appearance:   Well Groomed     Behavior:  Appropriate  Motor:  Normal  Speech/Language:   Normal Rate  Affect:  Appropriate  Mood:  anxious  Thought process:  normal  Thought content:    WNL  Sensory/Perceptual disturbances:    WNL  Orientation:  oriented to person, place, time/date and situation  Attention:  Good  Concentration:  Good  Memory:  WNL  Fund of knowledge:   Good  Insight:    Good  Judgment:   Good  Impulse Control:  Good   Risk Assessment: Danger to Self:  No Self-injurious Behavior: No Danger to Others: No Duty to Warn:no Physical Aggression / Violence:No  Access to Firearms a concern: No  Gang Involvement:No   Subjective: Patient was present for session. She shared that things are still up in the air with her daughter's school situation.  Patient explained she is looking into different options and feels lots of pressure to make a decision but does not feel ready.  Patient was encouraged to take her time and feel peace before she makes any decision.  Patient shared there was a very triggering incident with her older daughter.  She shared she realized that she is been weighing herself and is upset about her body because it is different than her peers.  Patient did EMDR set on that issue suds level 10, negative cognition "it is my fault" patient felt fear in her hands.  Patient was able to she has felt multiple things were her fault including the death of her mother.  Patient was able to work with that adolescent whose mother passed away.  Patient reported feeling more positive at the end of session.  Interventions: Solution-Oriented/Positive Psychology and Eye Movement  Desensitization and Reprocessing (EMDR)  Diagnosis:   ICD-10-CM   1. Generalized anxiety disorder  F41.1     Plan: Patient is to use CBT and coping skills to decrease anxiety symptoms.  Patient is to work on reminding herself that she was not at fault for her mother's death.  Patient is to take medication as directed.  Patient is to continue exercising to release negative emotions appropriately. Long-term goal: Resolve the core conflict that is a source of anxiety Short-term goal: Identify the major life complex from the past and present that form the basis for present anxiety  Stevphen Meuse, [facility]

## 2020-07-07 ENCOUNTER — Ambulatory Visit (INDEPENDENT_AMBULATORY_CARE_PROVIDER_SITE_OTHER): Payer: BC Managed Care – PPO | Admitting: Psychiatry

## 2020-07-07 ENCOUNTER — Other Ambulatory Visit: Payer: Self-pay

## 2020-07-07 DIAGNOSIS — F411 Generalized anxiety disorder: Secondary | ICD-10-CM | POA: Diagnosis not present

## 2020-07-07 NOTE — Progress Notes (Signed)
      [facility] Counselor/Therapist Progress Note  Patient ID: SHAQUILLA KEHRES, MRN: [id number],    Date: 07/07/2020  Time Spent: 50 minutes start time 9:10 AM end time 10 AM  Treatment Type: Individual Therapy  Reported Symptoms: anxiety  Mental Status Exam:  Appearance:   Well Groomed     Behavior:  Appropriate  Motor:  Normal  Speech/Language:   Normal Rate  Affect:  Appropriate  Mood:  anxious  Thought process:  normal  Thought content:    WNL  Sensory/Perceptual disturbances:    WNL  Orientation:  oriented to person, place, time/date and situation  Attention:  Good  Concentration:  Good  Memory:  WNL  Fund of knowledge:   Good  Insight:    Good  Judgment:   Good  Impulse Control:  Good   Risk Assessment: Danger to Self:  No Self-injurious Behavior: No Danger to Others: No Duty to Warn:no Physical Aggression / Violence:No  Access to Firearms a concern: No  Gang Involvement:No   Subjective: Patient was present for session.  She shared that she was doing well overall.  There was an incident with her dog over the weekend that was frustrating and has lead to lots of stress and anxiety.  She shared that she visited with her dad and he is doing much better which has helped her. Her daughter is healing and that has gone well.  Patient shared that she is struggling with her aunt's comments on Facebook.  Did EMDR set on a suds level 6, negative cognition "my reality is not true" felt uncertainty annoyance and confusion in her stomach.  Patient was able to resolve the set and reduce suds level to 1.  She was able to recognize that what she experienced was true just because somebody else did not have the same experience due to their perceptions does not invalidate her.  Patient was able to recognize at this time she is not ready to engage her aunt in a relationship and that that is an okay thing and she does not need to feel guilty about it.  Encourage patient to continue allowing  things to process over the next week.  Interventions: Solution-Oriented/Positive Psychology and Eye Movement Desensitization and Reprocessing (EMDR)  Diagnosis:   ICD-10-CM   1. Generalized anxiety disorder  F41.1     Plan: Patient is to use CBT and coping skills to decrease anxiety symptoms.  Patient is to continue working on making a decision concerning next year for herself and her children concerning school.  Patient is to take medication as directed.  Patient is to exercise to release negative emotions. Long-term goal: Resolve the core conflict that is a source of anxiety Short-term goal: Identify the major life complex from the past and present that form the basis for present anxiety  Stevphen Meuse, [facility]

## 2020-07-09 ENCOUNTER — Ambulatory Visit (INDEPENDENT_AMBULATORY_CARE_PROVIDER_SITE_OTHER): Payer: BC Managed Care – PPO | Admitting: Psychiatry

## 2020-07-09 ENCOUNTER — Other Ambulatory Visit: Payer: Self-pay

## 2020-07-09 ENCOUNTER — Encounter: Payer: Self-pay | Admitting: Psychiatry

## 2020-07-09 DIAGNOSIS — F411 Generalized anxiety disorder: Secondary | ICD-10-CM | POA: Diagnosis not present

## 2020-07-09 MED ORDER — SERTRALINE HCL 50 MG PO TABS: 50.0000 mg | ORAL_TABLET | Freq: Every day | ORAL | 1 refills | Status: DC
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
## 2020-07-21 ENCOUNTER — Ambulatory Visit (INDEPENDENT_AMBULATORY_CARE_PROVIDER_SITE_OTHER): Payer: BC Managed Care – PPO | Admitting: Psychiatry

## 2020-07-21 ENCOUNTER — Other Ambulatory Visit: Payer: Self-pay

## 2020-07-21 DIAGNOSIS — F411 Generalized anxiety disorder: Secondary | ICD-10-CM | POA: Diagnosis not present

## 2020-07-21 NOTE — Progress Notes (Signed)
      [facility] Counselor/Therapist Progress Note  Patient ID: Stephanie Harrington, MRN: [id number],    Date: 07/21/2020  Time Spent: 50 minutes start time 10:04 AM end time 10:54 AM  Treatment Type: Individual Therapy  Reported Symptoms: anxiety, frustration, triggered responses  Mental Status Exam:  Appearance:   Casual and Neat     Behavior:  Appropriate  Motor:  Normal  Speech/Language:   Normal Rate  Affect:  Appropriate  Mood:  anxious  Thought process:  normal  Thought content:    WNL  Sensory/Perceptual disturbances:    WNL  Orientation:  oriented to person, place, time/date and situation  Attention:  Good  Concentration:  Good  Memory:  WNL  Fund of knowledge:   Good  Insight:    Good  Judgment:   Good  Impulse Control:  Good   Risk Assessment: Danger to Self:  No Self-injurious Behavior: No Danger to Others: No Duty to Warn:no Physical Aggression / Violence:No  Access to Firearms a concern: No  Gang Involvement:No   Subjective: Patient was present for session.  She shared she is still having lots of anxiety about her children and next year in school. She is still interviewing for positions for the schools she is considering. Patient shared that her daughter's anxiety has increased and she is concerned about her.  Allowed patient time to discuss some of the different situations with her daughter that are occurring and helped her think through ways that she wants to manage it.  Patient was encouraged to recognize that due to the high intelligence of her children she will have to make sure to take care of her own anxiety so it is not triggered by their worries and concerns.  Patient was encouraged to talk with husband about taking his daughters on daughter that he dates once in a while one-on-one to help their relationship get stronger and hopefully allow them to talk more to him as well as patient.  Encourage patient to try and recognize how things are working out  especially if the what if start to surface so that she knows she does not have to fix everything and her own strength.  Interventions: Cognitive Behavioral Therapy and Solution-Oriented/Positive Psychology  Diagnosis:   ICD-10-CM   1. Generalized anxiety disorder  F41.1     Plan: Patient is to use CBT and coping skills to decrease anxiety symptoms.  Patient is to continue exercising to release negative emotions appropriately.  Patient is to discuss plans from session with husband to help decrease her anxiety.  Patient is to continue working on a decision for her children school for next year as well as her own job situation.  Patient is to take medication as directed Long-term goal: Resolve the core conflict that is a source of anxiety Short-term goal: Identify the major life complex from the past and present that form the basis for present anxiety  Stevphen Meuse, [facility]

## 2020-08-11 ENCOUNTER — Other Ambulatory Visit: Payer: Self-pay

## 2020-08-11 ENCOUNTER — Ambulatory Visit (INDEPENDENT_AMBULATORY_CARE_PROVIDER_SITE_OTHER): Payer: BC Managed Care – PPO | Admitting: Psychiatry

## 2020-08-11 DIAGNOSIS — F411 Generalized anxiety disorder: Secondary | ICD-10-CM
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
## 2020-08-27 ENCOUNTER — Ambulatory Visit: Payer: BC Managed Care – PPO | Admitting: Psychiatry

## 2020-09-28 ENCOUNTER — Other Ambulatory Visit: Payer: Self-pay

## 2020-09-28 ENCOUNTER — Ambulatory Visit (INDEPENDENT_AMBULATORY_CARE_PROVIDER_SITE_OTHER): Payer: BC Managed Care – PPO | Admitting: Psychiatry

## 2020-09-28 DIAGNOSIS — F411 Generalized anxiety disorder: Secondary | ICD-10-CM

## 2020-09-28 NOTE — Progress Notes (Signed)
      [facility] Counselor/Therapist Progress Note  Patient ID: Stephanie Harrington, MRN: [id number],    Date: 09/28/2020  Time Spent: 48 minutes start time 2:02 PM end time 2:50 PM  Treatment Type: Individual Therapy  Reported Symptoms: anxiety, nightmares, anger  Mental Status Exam:  Appearance:   Casual and Neat     Behavior:  Appropriate  Motor:  Normal  Speech/Language:   Normal Rate  Affect:  Appropriate  Mood:  anxious  Thought process:  normal  Thought content:    WNL  Sensory/Perceptual disturbances:    WNL  Orientation:  oriented to person, place, time/date, and situation  Attention:  Good  Concentration:  Good  Memory:  WNL  Fund of knowledge:   Good  Insight:    Good  Judgment:   Good  Impulse Control:  Good   Risk Assessment: Danger to Self:  No Self-injurious Behavior: No Danger to Others: No Duty to Warn:no Physical Aggression / Violence:No  Access to Firearms a concern: No  Gang Involvement:No   Subjective: Patient was present for session.  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  She shared she found a nest of black widows under their basketball goal and that led to a discussion with her husband.  Patient explained that her husband was not as ready to deal with the issue as she was.  She explained that her kids and neighborhood kids climb on that basketball goal and when they watched 3 black widows come out at from it and take it in a wasp she knew it was not good.  They were able to deal with the issue and when they turned the goal over there was an nest of over 20 black widows.  Patient stated that that led to the conversation with her husband that she does not feel he takes a lead in managing things with the house and the kids.  He admitted he leaves all that on her.  Patient was allowed to discuss her frustrations and what she hoped for the 2 of them.  Encouraged patient to initiate discussions about what they want for their  children and their future to lead to different ways that they each can engage in behaviors that we will move towards those goals.  Patient was encouraged to recognize that she has to focus on the things that she can control fix and change and give her husband time to make some adjustments on his own.  Interventions: Solution-Oriented/Positive Psychology  Diagnosis:   ICD-10-CM   1. Generalized anxiety disorder  F41.1       Plan: Patient is to use CBT and coping skills to decrease anxiety symptoms.  Patient is to focus on the things that she can control fix and change.  Patient is to follow plans from session to communicate with her husband concerning what they want for their future and the family as well as plans to lead him in that direction.  Patient is to continue exercising to release negative emotions appropriately.  Patient is to take medication as directed. Long-term goal: Resolve the core conflict that is a source of anxiety Short-term goal: Identify the major life complex from the past and present that form the basis for present anxiety  Stevphen Meuse, [facility]

## 2020-10-08 ENCOUNTER — Ambulatory Visit: Payer: BC Managed Care – PPO | Admitting: Psychiatry

## 2021-01-26 ENCOUNTER — Other Ambulatory Visit: Payer: Self-pay | Admitting: Psychiatry

## 2021-01-26 DIAGNOSIS — F411 Generalized anxiety disorder: Secondary | ICD-10-CM

## 2021-03-05 ENCOUNTER — Other Ambulatory Visit: Payer: Self-pay | Admitting: Psychiatry

## 2021-03-05 DIAGNOSIS — F411 Generalized anxiety disorder: Secondary | ICD-10-CM

## 2021-03-29 ENCOUNTER — Ambulatory Visit: Payer: BC Managed Care – PPO | Admitting: Psychiatry

## 2021-04-09 ENCOUNTER — Other Ambulatory Visit: Payer: Self-pay | Admitting: Psychiatry

## 2021-04-09 DIAGNOSIS — F411 Generalized anxiety disorder: Secondary | ICD-10-CM

## 2021-04-12 ENCOUNTER — Telehealth: Payer: Self-pay | Admitting: Psychiatry

## 2021-05-03 ENCOUNTER — Ambulatory Visit: Payer: BC Managed Care – PPO | Admitting: Psychiatry

## 2021-05-08 ENCOUNTER — Other Ambulatory Visit: Payer: Self-pay | Admitting: Psychiatry

## 2021-05-08 DIAGNOSIS — F411 Generalized anxiety disorder: Secondary | ICD-10-CM

## 2021-05-10 ENCOUNTER — Encounter: Payer: Self-pay | Admitting: Psychiatry

## 2021-05-10 ENCOUNTER — Ambulatory Visit (INDEPENDENT_AMBULATORY_CARE_PROVIDER_SITE_OTHER): Payer: BC Managed Care – PPO | Admitting: Psychiatry

## 2021-05-10 DIAGNOSIS — F411 Generalized anxiety disorder: Secondary | ICD-10-CM

## 2021-05-10 MED ORDER — SERTRALINE HCL 50 MG PO TABS: 50.0000 mg | ORAL_TABLET | Freq: Every day | ORAL | 3 refills | Status: DC

## 2021-05-10 NOTE — Progress Notes (Signed)
Stephanie Harrington [id number] 06/14/1980 41 y.o.  Virtual Visit via Telephone Note  I connected with pt on 05/10/21 at 11:30 AM EST by telephone and verified that I am speaking with the correct person using two identifiers.   I discussed the limitations, risks, security and privacy concerns of performing an evaluation and management service by telephone and the availability of in person appointments. I also discussed with the patient that there may be a patient responsible charge related to this service. The patient expressed understanding and agreed to proceed.   I discussed the assessment and treatment plan with the patient. The patient was provided an opportunity to ask questions and all were answered. The patient agreed with the plan and demonstrated an understanding of the instructions.   The patient was advised to call back or seek an in-person evaluation if the symptoms worsen or if the condition fails to improve as anticipated.  I provided 10 minutes of non-face-to-face time during this encounter.  The patient was located at work.  The provider was located at [facility].   Stephanie Harrington, PMHNP   Subjective:   Patient ID:  Stephanie Harrington is a 70 y.o. (DOB 15-Sep-1980) female.  Chief Complaint:  Chief Complaint  Patient presents with   Follow-up    Anxiety    HPI Stephanie Harrington presents for follow-up of anxiety She reports that "things are going well." She denies anxiety. She reports that she is not having anxiety in response to stressors that would have triggered anxiety in the past. She reports that there is a "healthy amount" of anxiety "to where I am not numb." Denies panic attacks. Denies depressed mood. Sleeping well. Energy and motivation have been good. Appetite has been good. Notices she snacks more. Reports 10 lbs weight gain. Concentration has been good. Denies SI.   She now has an IUD and no longer having mood fluctuations around menstrual cycle.   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   She is enjoying working at [employer]. She reports that 2 of her children are at [employer] and her 3rd child will be starting next year.  Review of Systems:  Review of Systems  Gastrointestinal: Negative.   Musculoskeletal:  Positive for back pain. Negative for gait problem.  Allergic/Immunologic: Positive for environmental allergies.  Psychiatric/Behavioral:         Please refer to HPI   Medications: I have reviewed the patient's current medications.  Current Outpatient Medications  Medication Sig Dispense Refill   acetaminophen (TYLENOL) 325 MG tablet Take 2 tablets (650 mg total) by mouth every 4 (four) hours as needed (for pain scale < 4).     Biotin w/ Vitamins C & E (HAIR SKIN & NAILS GUMMIES PO) Take by mouth.     Coenzyme Q10 (CO Q 10 PO) Take by mouth.     COLLAGEN PO Take by mouth.     fexofenadine (ALLEGRA) 180 MG tablet Take 180 mg by mouth daily as needed for allergies or rhinitis.     levonorgestrel (MIRENA, 52 MG,) 20 MCG/DAY IUD 1 each by Intrauterine route once.     MAGNESIUM PO Take by mouth.     Multiple Vitamins-Minerals (WOMENS MULTI PO) Take by mouth.     sertraline (ZOLOFT) 50 MG tablet Take 1 tablet (50 mg total) by mouth daily. 90 tablet 3   No current facility-administered medications for this visit.    Medication Side Effects: None  Allergies: No Known Allergies  Past Medical  History:  Diagnosis Date   Anemia    childhood   Depression    GERD (gastroesophageal reflux disease)    no meds   H/O varicella    Hemorrhoid    IBS (irritable bowel syndrome)    Pill esophagitis due to doxycycline 12/29/2016   Pneumonia    walking pneumonia in childhood   Postpartum care following cesarean delivery (9/30) 12/17/2013   Recurrent upper respiratory infection (URI)     Family History  Problem Relation Age of Onset   Heart disease Mother    Heart attack Mother    Bipolar disorder Mother    Anxiety  disorder Mother    Paranoid behavior Mother    Hyperlipidemia Father    Hypertension Father    Cancer Father        prostate   Lymphoma Father    Cancer Maternal Grandmother        breast   Heart disease Maternal Grandmother    Coronary artery disease Maternal Grandmother    Anxiety disorder Maternal Grandmother    Anxiety disorder Daughter     Social History   Socioeconomic History   Marital status: Married    Spouse name: Stephanie Harrington   Number of children: 3   Years of education: 16   Highest education level: Not on file  Occupational History   Occupation: [employer] features editor: [employer]  Tobacco Use   Smoking status: Former    Packs/day: 0.25    Years: 9.00    Pack years: 2.25    Types: Cigarettes    Quit date: 12/18/2009    Years since quitting: 11.4   Smokeless tobacco: Never  Vaping Use   Vaping Use: Never used  Substance and Sexual Activity   Alcohol use: Yes    Comment: occasional   Drug use: No   Sexual activity: Yes    Partners: Male    Birth control/protection: None    Comment: 1st intercourse- 17, partners- 3, married- 10 yrs   Other Topics Concern   Not on file  Social History Narrative   Married to [place], 2 children, preschool teacher [employer]   Social Determinants of Health   Financial Resource Strain: Not on file  Food Insecurity: Not on file  Transportation Needs: Not on file  Physical Activity: Not on file  Stress: Not on file  Social Connections: Not on file  Intimate Partner Violence: Not on file    Past Medical History, Surgical history, Social history, and Family history were reviewed and updated as appropriate.   Please see review of systems for further details on the patient's review from today.   Objective:   Physical Exam:  There were no vitals taken for this visit.  Physical Exam Neurological:     Mental Status: She is alert and oriented to person, place, and time.     Cranial Nerves: No  dysarthria.  Psychiatric:        Attention and Perception: Attention and perception normal.        Mood and Affect: Mood normal.        Speech: Speech normal.        Behavior: Behavior is cooperative.        Thought Content: Thought content normal. Thought content is not paranoid or delusional. Thought content does not include homicidal or suicidal ideation. Thought content does not include homicidal or suicidal plan.        Cognition and Memory: Cognition and  memory normal.        Judgment: Judgment normal.     Comments: Insight intact    Lab Review:     Component Value Date/Time   NA 135 12/15/2016 1835   K 3.2 (L) 12/15/2016 1835   CL 102 12/15/2016 1835   CO2 24 12/15/2016 1835   GLUCOSE 85 12/15/2016 1835   BUN 9 12/15/2016 1835   CREATININE 0.91 12/15/2016 1835   CALCIUM 9.2 12/15/2016 1835   GFRNONAA >60 12/15/2016 1835   GFRAA >60 12/15/2016 1835       Component Value Date/Time   WBC 10.7 (H) 01/11/2018 0553   RBC 4.06 01/11/2018 0553   HGB 11.5 (L) 01/11/2018 0553   HCT 33.6 (L) 01/11/2018 0553   PLT 208 01/11/2018 0553   MCV 82.8 01/11/2018 0553   MCH 28.3 01/11/2018 0553   MCHC 34.2 01/11/2018 0553   RDW 12.9 01/11/2018 0553    No results found for: POCLITH, LITHIUM   No results found for: PHENYTOIN, PHENOBARB, VALPROATE, CBMZ   .res Assessment: Plan:   Will continue current plan of care since target signs and symptoms are well controlled without any tolerability issues. Continue Sertraline 50 mg po qd for anxiety.  Pt to follow-up in 1 year or sooner if clinically indicated.  Patient advised to contact office with any questions, adverse effects, or acute worsening in signs and symptoms.    Trenna was seen today for follow-up.  Diagnoses and all orders for this visit:  Generalized anxiety disorder -     sertraline (ZOLOFT) 50 MG tablet; Take 1 tablet (50 mg total) by mouth daily.    Please see After Visit Summary for patient specific  instructions.  No future appointments.  No orders of the defined types were placed in this encounter.     -------------------------------

## 2021-06-12 DIAGNOSIS — S93492A Sprain of other ligament of left ankle, initial encounter: Secondary | ICD-10-CM | POA: Diagnosis not present

## 2021-06-20 ENCOUNTER — Other Ambulatory Visit: Payer: Self-pay

## 2021-06-20 DIAGNOSIS — F411 Generalized anxiety disorder: Secondary | ICD-10-CM

## 2021-06-20 MED ORDER — SERTRALINE HCL 50 MG PO TABS: 50.0000 mg | ORAL_TABLET | Freq: Every day | ORAL | 3 refills | Status: DC

## 2021-08-09 NOTE — Telephone Encounter (Signed)
Error

## 2021-08-26 DIAGNOSIS — M545 Low back pain, unspecified: Secondary | ICD-10-CM | POA: Diagnosis not present

## 2021-09-23 ENCOUNTER — Ambulatory Visit (INDEPENDENT_AMBULATORY_CARE_PROVIDER_SITE_OTHER): Payer: BC Managed Care – PPO | Admitting: Family Medicine

## 2021-09-23 ENCOUNTER — Encounter: Payer: Self-pay | Admitting: Family Medicine

## 2021-09-23 VITALS — BP 120/80 | HR 63 | Temp 98.3°F | Ht 64.0 in | Wt 130.2 lb

## 2021-09-23 DIAGNOSIS — M549 Dorsalgia, unspecified: Secondary | ICD-10-CM

## 2021-09-23 DIAGNOSIS — Z0000 Encounter for general adult medical examination without abnormal findings: Secondary | ICD-10-CM | POA: Diagnosis not present

## 2021-09-23 DIAGNOSIS — Z8241 Family history of sudden cardiac death: Secondary | ICD-10-CM | POA: Diagnosis not present

## 2021-09-23 LAB — LIPID PANEL
Cholesterol: 197 mg/dL (ref 0–200)
HDL: 53.6 mg/dL (ref >39.00)
LDL Cholesterol: 110 mg/dL — ABNORMAL HIGH (ref 0–99)
NonHDL: 143.66
Total CHOL/HDL Ratio: 4
Triglycerides: 169 mg/dL — ABNORMAL HIGH (ref 0.0–149.0)
VLDL: 33.8 mg/dL (ref 0.0–40.0)

## 2021-09-23 LAB — COMPREHENSIVE METABOLIC PANEL
ALT: 29 U/L (ref 0–35)
AST: 23 U/L (ref 0–37)
Albumin: 4.6 g/dL (ref 3.5–5.2)
Alkaline Phosphatase: 57 U/L (ref 39–117)
BUN: 11 mg/dL (ref 6–23)
CO2: 27 mEq/L (ref 19–32)
Calcium: 9.8 mg/dL (ref 8.4–10.5)
Chloride: 105 mEq/L (ref 96–112)
Creatinine, Ser: 0.83 mg/dL (ref 0.40–1.20)
GFR: 88.74 mL/min (ref >60.00)
Glucose, Bld: 84 mg/dL (ref 70–99)
Potassium: 4.4 mEq/L (ref 3.5–5.1)
Sodium: 141 mEq/L (ref 135–145)
Total Bilirubin: 0.5 mg/dL (ref 0.2–1.2)
Total Protein: 7.2 g/dL (ref 6.0–8.3)

## 2021-09-23 LAB — CBC WITH DIFFERENTIAL/PLATELET
Basophils Absolute: 0.1 10*3/uL (ref 0.0–0.1)
Basophils Relative: 1.4 % (ref 0.0–3.0)
Eosinophils Absolute: 0.2 10*3/uL (ref 0.0–0.7)
Eosinophils Relative: 4.1 % (ref 0.0–5.0)
HCT: 41.9 % (ref 36.0–46.0)
Hemoglobin: 14.1 g/dL (ref 12.0–15.0)
Lymphocytes Relative: 25.1 % (ref 12.0–46.0)
Lymphs Abs: 1.1 10*3/uL (ref 0.7–4.0)
MCHC: 33.7 g/dL (ref 30.0–36.0)
MCV: 87.6 fl (ref 78.0–100.0)
Monocytes Absolute: 0.3 10*3/uL (ref 0.1–1.0)
Monocytes Relative: 7.2 % (ref 3.0–12.0)
Neutro Abs: 2.8 10*3/uL (ref 1.4–7.7)
Neutrophils Relative %: 62.2 % (ref 43.0–77.0)
Platelets: 228 10*3/uL (ref 150.0–400.0)
RBC: 4.78 Mil/uL (ref 3.87–5.11)
RDW: 12.8 % (ref 11.5–15.5)
WBC: 4.5 10*3/uL (ref 4.0–10.5)

## 2021-09-23 LAB — TSH: TSH: 1.64 u[IU]/mL (ref 0.35–5.50)

## 2021-09-23 NOTE — Progress Notes (Signed)
New Patient Office Visit  Subjective:  Patient ID: Stephanie Harrington, female    DOB: 04/07/1980  Age: 41 y.o. MRN: [id number]  CC:  Chief Complaint  Patient presents with   Establish Care    Need new pcp Need an annual exam if possible Not fasting    Back Pain    Pulled muscle in upper back 4 weeks ago, re injured 2 weeks ago     HPI Stephanie Harrington presents for new pt CPX-exercises regularly.  Getting some joint pain.  Twisted ankle in march.   June-picke up 41 yo and felt pain upper back-went to ortho urgent care-pred/muscle relaxor.  Felt better for 4 wks and then turned sideways and felt immediately same type of pain.  Took ibu and tizanidine that day.  Still  sore.  No rad. 1 spot only  no n/t.   Past Medical History:  Diagnosis Date   Anemia    childhood   Anxiety    Depression    GERD (gastroesophageal reflux disease)    no meds   H/O varicella    Hemorrhoid    IBS (irritable bowel syndrome)    Pill esophagitis due to doxycycline 12/29/2016   Pneumonia    walking pneumonia in childhood   Postpartum care following cesarean delivery (9/30) 12/17/2013   Recurrent upper respiratory infection (URI)     Past Surgical History:  Procedure Laterality Date   addenoidectomy     CESAREAN SECTION  11/18/2011   Procedure: CESAREAN SECTION;  Surgeon: Robley Fries, MD;  Location: [facility];  Service: Gynecology;  Laterality: N/A;  Primary cesarean section of baby girl at 2013  APGAR 9/9   CESAREAN SECTION N/A 12/17/2013   Procedure: Repeat CESAREAN SECTION;  Surgeon: Robley Fries, MD;  Location: [facility];  Service: Obstetrics;  Laterality: N/A;  EDD: 12/17/13   CESAREAN SECTION N/A 01/10/2018   Procedure: Repeat CESAREAN SECTION;  Surgeon: Shea Evans, MD;  Location: [facility];  Service: Obstetrics;  Laterality: N/A;  EDD: 01/15/18  Tracey RNFA   COLONOSCOPY     41yo for ibs   DILATION AND EVACUATION  12/19/2010   Procedure: DILATATION AND EVACUATION (D&E);   Surgeon: Robley Fries;  Location: [facility];  Service: Gynecology;  Laterality: N/A;   TONSILLECTOMY     UMBILICAL HERNIA REPAIR N/A 07/27/2015   Procedure:  REPAIR UMBILICAL HERNIA;  Surgeon: Harriette Bouillon, MD;  Location: [facility];  Service: General;  Laterality: N/A;   WISDOM TOOTH EXTRACTION      Family History  Problem Relation Age of Onset   Heart disease Mother 34   Heart attack Mother    Bipolar disorder Mother    Anxiety disorder Mother    Paranoid behavior Mother    Hyperlipidemia Father    Hypertension Father    Cancer Father        prostate   Lymphoma Father    Anxiety disorder Daughter    Cancer Maternal Grandmother        breast   Heart disease Maternal Grandmother    Coronary artery disease Maternal Grandmother    Anxiety disorder Maternal Grandmother     Social History   Socioeconomic History   Marital status: Married    Spouse name: Arlys John   Number of children: 3   Years of education: 16   Highest education level: Not on file  Occupational History   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    Comment: 1st intercourse- 17, partners- 35, married- 8 yrs   Other Topics Concern   Not on file  Social History Narrative   Married to [place], 3 children,1st grade teacher   Social Determinants of Health   Financial Resource Strain: Low Risk  (12/25/2017)   Overall Financial Resource Strain (CARDIA)    Difficulty of Paying Living Expenses: Not  hard at all  Food Insecurity: No Food Insecurity (12/25/2017)   Hunger Vital Sign    Worried About Running Out of Food in the Last Year: Never true    [place] in the Last Year: Never true  Transportation Needs: Unknown (12/25/2017)   PRAPARE - Hydrologist (Medical): No    Lack of Transportation (Non-Medical): Not on file  Physical Activity: Not on file  Stress: No Stress Concern Present (12/25/2017)   [place]    Feeling of Stress : Only a little  Social Connections: Not on file  Intimate Partner Violence: Not At Risk (12/25/2017)   Humiliation, Afraid, Rape, and Kick questionnaire    Fear of Current or Ex-Partner: No    Emotionally Abused: No    Physically Abused: No    Sexually Abused: No    ROS  ROS: Gen: no fever, chills  Skin: no rash, itching ENT: no ear pain, ear drainage, nasal congestion, rhinorrhea, sinus pressure, sore throat.  Saw opt yesterday Eyes: no blurry vision, double vision Resp: no cough, wheeze,SOB CV: no CP, palpitations, LE edema,  GI: no heartburn, n/v/d/c, abd pain GU: no dysuria, urgency, frequency, hematuria MSK: no joint pain, myalgias, back pain Neuro: no dizziness, headache, weakness, vertigo Psych: no depression, anxiety, insomnia, SI   Objective:   Today's Vitals: BP 120/80   Pulse 63   Temp 98.3 F (36.8 C) (Temporal)   Ht 5\' 4"  (1.626 m)   Wt 130 lb 4 oz (59.1 kg)   SpO2 99%   Breastfeeding No   BMI 22.36 kg/m   Physical Exam  Gen: WDWN NAD HEENT: NCAT, conjunctiva not injected, sclera nonicteric TM WNL B, OP moist, no exudates  NECK:  supple, no thyromegaly, no nodes, no carotid bruits CARDIAC: RRR, S1S2+, no murmur. DP 2+B LUNGS: CTAB. No wheezes ABDOMEN:  BS+, soft, NTND, No HSM, no masses EXT:  no edema MSK: no gross abnormalities. Some tightness L upper back muscles.  No TTP spine NEURO: A&O x3.  CN II-XII intact.   PSYCH: normal mood. Good eye contact   Assessment & Plan:   Problem List Items Addressed This Visit       Cardiovascular and Mediastinum   Family history of sudden cardiac death   Relevant Orders   Lipoprotein A (LPA)   CT CARDIAC SCORING (SELF PAY ONLY)   Other Visit Diagnoses  Wellness examination    -  Primary   Relevant Orders   Comprehensive metabolic panel   TSH   Lipid panel   Hepatitis C antibody   CBC with Differential/Platelet   Lipoprotein A (LPA)   Upper back pain       Relevant Medications   tiZANidine (ZANAFLEX) 4 MG tablet      Wellness-rhm utd-check cbc,cmp,tsh,lipids FH early CAD-check Lpa, check calcium score Upper back pain-stretches, ibu prn.  Home PT exercises.  Consider PT.  Get massage.  Outpatient Encounter Medications as of 09/23/2021  Medication Sig   COLLAGEN PO Take by mouth.   levonorgestrel (MIRENA, 52 MG,) 20 MCG/DAY IUD 1 each by Intrauterine route once.   MAGNESIUM PO Take by mouth.   MAGNESIUM PO    Multiple Vitamins-Minerals (WOMENS MULTI PO) Take by mouth.   Olopatadine HCl (PATADAY) 0.2 % SOLN 1 drop into affected eye   sertraline (ZOLOFT) 50 MG tablet Take 1 tablet (50 mg total) by mouth daily.   tiZANidine (ZANAFLEX) 4 MG tablet Take 4 mg by mouth 3 (three) times daily as needed.   [DISCONTINUED] acetaminophen (TYLENOL) 325 MG tablet Take 2 tablets (650 mg total) by mouth every 4 (four) hours as needed (for pain scale < 4).   [DISCONTINUED] Biotin w/ Vitamins C & E (HAIR SKIN & NAILS GUMMIES PO) Take by mouth.   [DISCONTINUED] Coenzyme Q10 (CO Q 10 PO) Take by mouth.   [DISCONTINUED] fexofenadine (ALLEGRA) 180 MG tablet Take 180 mg by mouth daily as needed for allergies or rhinitis.   No facility-administered encounter medications on file as of 09/23/2021.    Follow-up: Return in about 1 year (around 09/24/2022) for annual.   Angelena Sole, MD

## 2021-09-23 NOTE — Patient Instructions (Signed)
Welcome to [facility]! It was a pleasure meeting you today.  As discussed, Please schedule a 12 month follow up visit today.  [phone] for cardiac calcium score   PLEASE NOTE:  If you had any LAB tests please let us know if you have not heard back within a few days. You may see your results on MyChart before we have a chance to review them but we will give you a call once they are reviewed by [place]. If we ordered any REFERRALS today, please let us know if you have not heard from their office within the next week.  Let us know through MyChart if you are needing REFILLS, or have your pharmacy send [place] the request. You can also use MyChart to communicate with me or any office staff.  Please try these tips to maintain a healthy lifestyle:  Eat most of your calories during the day when you are active. Eliminate processed foods including packaged sweets (pies, cakes, cookies), reduce intake of potatoes, white bread, white pasta, and white rice. Look for whole grain options, oat flour or almond flour.  Each meal should contain half fruits/vegetables, one quarter protein, and one quarter carbs (no bigger than a computer mouse).  Cut down on sweet beverages. This includes juice, soda, and sweet tea. Also watch fruit intake, though this is a healthier sweet option, it still contains natural sugar! Limit to 3 servings daily.  Drink at least 1 glass of water with each meal and aim for at least 8 glasses per day  Exercise at least 150 minutes every week.

## 2021-09-29 ENCOUNTER — Encounter: Payer: Self-pay | Admitting: Family Medicine

## 2021-09-29 DIAGNOSIS — E7841 Elevated Lipoprotein(a): Secondary | ICD-10-CM

## 2021-09-29 LAB — HEPATITIS C ANTIBODY: Hepatitis C Ab: NONREACTIVE

## 2021-09-29 LAB — LIPOPROTEIN A (LPA): Lipoprotein (a): 154 nmol/L — ABNORMAL HIGH (ref <75)

## 2021-09-30 MED ORDER — ATORVASTATIN CALCIUM 10 MG PO TABS: 10.0000 mg | ORAL_TABLET | Freq: Every day | ORAL | 3 refills | Status: DC

## 2021-11-08 DIAGNOSIS — Z6823 Body mass index (BMI) 23.0-23.9, adult: Secondary | ICD-10-CM | POA: Diagnosis not present

## 2021-11-08 DIAGNOSIS — R300 Dysuria: Secondary | ICD-10-CM | POA: Diagnosis not present

## 2021-11-09 ENCOUNTER — Ambulatory Visit: Payer: BC Managed Care – PPO | Admitting: Family Medicine

## 2021-11-25 ENCOUNTER — Other Ambulatory Visit: Payer: BC Managed Care – PPO

## 2021-11-29 ENCOUNTER — Other Ambulatory Visit: Payer: BC Managed Care – PPO

## 2021-12-01 ENCOUNTER — Other Ambulatory Visit: Payer: BC Managed Care – PPO

## 2021-12-12 ENCOUNTER — Encounter: Payer: Self-pay | Admitting: *Deleted

## 2022-03-02 ENCOUNTER — Encounter: Payer: Self-pay | Admitting: *Deleted

## 2022-03-06 ENCOUNTER — Ambulatory Visit
Admission: RE | Admit: 2022-03-06 | Discharge: 2022-03-06 | Disposition: A | Discharge status: Home / Self Care | Payer: BC Managed Care – PPO | Source: Ambulatory Visit | Attending: Family Medicine | Admitting: Family Medicine

## 2022-03-06 ENCOUNTER — Other Ambulatory Visit: Payer: BC Managed Care – PPO

## 2022-03-06 DIAGNOSIS — Z8249 Family history of ischemic heart disease and other diseases of the circulatory system: Secondary | ICD-10-CM | POA: Diagnosis not present

## 2022-03-06 DIAGNOSIS — Z8241 Family history of sudden cardiac death: Secondary | ICD-10-CM

## 2022-03-22 ENCOUNTER — Encounter: Payer: Self-pay | Admitting: Family Medicine

## 2022-03-22 ENCOUNTER — Other Ambulatory Visit: Payer: Self-pay | Admitting: *Deleted

## 2022-03-22 DIAGNOSIS — E7841 Elevated Lipoprotein(a): Secondary | ICD-10-CM

## 2022-03-22 MED ORDER — ATORVASTATIN CALCIUM 10 MG PO TABS: 10.0000 mg | ORAL_TABLET | Freq: Every day | ORAL | 3 refills | Status: DC

## 2022-05-06 DIAGNOSIS — R0602 Shortness of breath: Secondary | ICD-10-CM | POA: Diagnosis not present

## 2022-05-06 DIAGNOSIS — R0982 Postnasal drip: Secondary | ICD-10-CM | POA: Diagnosis not present

## 2022-05-06 DIAGNOSIS — R059 Cough, unspecified: Secondary | ICD-10-CM | POA: Diagnosis not present

## 2022-06-21 ENCOUNTER — Other Ambulatory Visit: Payer: Self-pay | Admitting: Psychiatry

## 2022-06-21 DIAGNOSIS — F411 Generalized anxiety disorder: Secondary | ICD-10-CM

## 2022-06-23 NOTE — Telephone Encounter (Signed)
Please call to schedule an appt, yearly F/U but was due in February.

## 2022-06-28 NOTE — Telephone Encounter (Addendum)
Called LVM over due for annual follow up. Call to schedule

## 2022-06-30 NOTE — Telephone Encounter (Signed)
Pt called at 9:50a.  She requested refill of Sertraline to [facility].  Next appt 5/16

## 2022-08-03 ENCOUNTER — Encounter: Payer: Self-pay | Admitting: Psychiatry

## 2022-08-03 ENCOUNTER — Ambulatory Visit (INDEPENDENT_AMBULATORY_CARE_PROVIDER_SITE_OTHER): Payer: BC Managed Care – PPO | Admitting: Psychiatry

## 2022-08-03 DIAGNOSIS — F411 Generalized anxiety disorder: Secondary | ICD-10-CM

## 2022-08-03 MED ORDER — SERTRALINE HCL 50 MG PO TABS: 50.0000 mg | ORAL_TABLET | Freq: Every day | ORAL | 3 refills | Status: DC

## 2022-08-03 NOTE — Progress Notes (Signed)
Stephanie Harrington [id number] 11/27/1980 42 y.o.  Subjective:   Patient ID:  Stephanie Harrington is a 89 y.o. (DOB Dec 07, 1980) female.  Chief Complaint:  Chief Complaint  Patient presents with   Follow-up    Anxiety    HPI Stephanie Harrington presents to the office today for follow-up of anxiety. She reports that her anxiety has been well controlled. Denies checking behaviors. Denies depressed mood. She reports that IUD remains helpful for menstrual related mood changes. She reports that her energy has been ok. Motivation has been good. She reports that she is sleeping well. Appetite has been good. Denies difficulty with concentration. Denies SI.   She is teaching full-time.Her 3 children all go to [place]. One child in competitive gymnastics and another child is doing competitive dance. She will be at home with her children over the summer. She reports that her middle child is now on Sertraline and has done well with it.   She reports that she has good support systems.   PHQ2-9    Flowsheet Row Office Visit from 09/23/2021 in [facility]  PHQ-2 Total Score 0  PHQ-9 Total Score 0        Review of Systems:  Review of Systems  Musculoskeletal:  Negative for gait problem.  Neurological:  Negative for tremors.  Psychiatric/Behavioral:         Please refer to HPI    Medications: I have reviewed the patient's current medications.  Current Outpatient Medications  Medication Sig Dispense Refill   COLLAGEN PO Take by mouth.     fexofenadine (ALLEGRA) 180 MG tablet Take 180 mg by mouth daily as needed for allergies or rhinitis.     levonorgestrel (MIRENA, 52 MG,) 20 MCG/DAY IUD 1 each by Intrauterine route once.     MAGNESIUM PO      Olopatadine HCl (PATADAY) 0.2 % SOLN 1 drop into affected eye     atorvastatin (LIPITOR) 10 MG tablet Take 1 tablet (10 mg total) by mouth daily. (Patient not taking: Reported on 08/03/2022) 90 tablet 3   sertraline (ZOLOFT) 50 MG tablet  Take 1 tablet (50 mg total) by mouth daily. 90 tablet 3   No current facility-administered medications for this visit.    Medication Side Effects: Other: Possible mild sexual side effects  Allergies: No Known Allergies  Past Medical History:  Diagnosis Date   Anemia    childhood   Anxiety    Depression    GERD (gastroesophageal reflux disease)    no meds   H/O varicella    Hemorrhoid    IBS (irritable bowel syndrome)    Pill esophagitis due to doxycycline 12/29/2016   Pneumonia    walking pneumonia in childhood   Postpartum care following cesarean delivery (9/30) 12/17/2013   Recurrent upper respiratory infection (URI)     Past Medical History, Surgical history, Social history, and Family history were reviewed and updated as appropriate.   Please see review of systems for further details on the patient's review from today.   Objective:   Physical Exam:  There were no vitals taken for this visit.  Physical Exam Constitutional:      General: She is not in acute distress. Musculoskeletal:        General: No deformity.  Neurological:     Mental Status: She is alert and oriented to person, place, and time.     Coordination: Coordination normal.  Psychiatric:        Attention and Perception:  Attention and perception normal. She does not perceive auditory or visual hallucinations.        Mood and Affect: Mood normal. Mood is not anxious or depressed. Affect is not labile, blunt, angry or inappropriate.        Speech: Speech normal.        Behavior: Behavior normal.        Thought Content: Thought content normal. Thought content is not paranoid or delusional. Thought content does not include homicidal or suicidal ideation. Thought content does not include homicidal or suicidal plan.        Cognition and Memory: Cognition and memory normal.        Judgment: Judgment normal.     Comments: Insight intact     Lab Review:     Component Value Date/Time   NA 141 09/23/2021  0913   K 4.4 09/23/2021 0913   CL 105 09/23/2021 0913   CO2 27 09/23/2021 0913   GLUCOSE 84 09/23/2021 0913   BUN 11 09/23/2021 0913   CREATININE 0.83 09/23/2021 0913   CALCIUM 9.8 09/23/2021 0913   PROT 7.2 09/23/2021 0913   ALBUMIN 4.6 09/23/2021 0913   AST 23 09/23/2021 0913   ALT 29 09/23/2021 0913   ALKPHOS 57 09/23/2021 0913   BILITOT 0.5 09/23/2021 0913   GFRNONAA >60 12/15/2016 1835   GFRAA >60 12/15/2016 1835       Component Value Date/Time   WBC 4.5 09/23/2021 0913   RBC 4.78 09/23/2021 0913   HGB 14.1 09/23/2021 0913   HCT 41.9 09/23/2021 0913   PLT 228.0 09/23/2021 0913   MCV 87.6 09/23/2021 0913   MCH 28.3 01/11/2018 0553   MCHC 33.7 09/23/2021 0913   RDW 12.8 09/23/2021 0913   LYMPHSABS 1.1 09/23/2021 0913   MONOABS 0.3 09/23/2021 0913   EOSABS 0.2 09/23/2021 0913   BASOSABS 0.1 09/23/2021 0913    No results found for: "POCLITH", "LITHIUM"   No results found for: "PHENYTOIN", "PHENOBARB", "VALPROATE", "CBMZ"   .res Assessment: Plan:   Discussed possible mild sexual side effects with Sertraline and options to reduce sexual side effects to include lowering dose, switching to a medication associated with less risk of sexual side effects (such as Buspar or Viibryd), or continuing current plan. She reports that she would like to continue Sertraline 50 mg po qd since benefits are currently outweighing side effects.  Will continue Sertraline 50 mg po qd for anxiety.  Pt to follow-up in one year or sooner if clinically indicated. Patient advised to contact office with any questions, adverse effects, or acute worsening in signs and symptoms.  I spent 30 minutes dedicated to the care of this patient on the date of this  encounter to include pre-visit review of records, face-to-face time with the patient discussing side effect, ordering of medication, and post visit documentation.   Stephanie Harrington was seen today for follow-up.  Diagnoses and all orders for this  visit:  Generalized anxiety disorder -     sertraline (ZOLOFT) 50 MG tablet; Take 1 tablet (50 mg total) by mouth daily.     Please see After Visit Summary for patient specific instructions.  Future Appointments  Date Time Provider Department Center  09/25/2022  8:00 AM Jeani Sow, MD [facility]  08/02/2023  4:00 PM [name], PMHNP [facility] None    No orders of the defined types were placed in this encounter.   -------------------------------

## 2022-09-25 ENCOUNTER — Encounter: Payer: BC Managed Care – PPO | Admitting: Family Medicine

## 2022-10-10 ENCOUNTER — Encounter: Payer: Self-pay | Admitting: Family Medicine

## 2022-10-10 ENCOUNTER — Ambulatory Visit (INDEPENDENT_AMBULATORY_CARE_PROVIDER_SITE_OTHER): Payer: BC Managed Care – PPO | Admitting: Family Medicine

## 2022-10-10 VITALS — BP 124/80 | HR 55 | Temp 98.3°F | Resp 16 | Ht 64.0 in | Wt 133.5 lb

## 2022-10-10 DIAGNOSIS — Z0000 Encounter for general adult medical examination without abnormal findings: Secondary | ICD-10-CM

## 2022-10-10 DIAGNOSIS — Z124 Encounter for screening for malignant neoplasm of cervix: Secondary | ICD-10-CM | POA: Diagnosis not present

## 2022-10-10 DIAGNOSIS — Z1231 Encounter for screening mammogram for malignant neoplasm of breast: Secondary | ICD-10-CM | POA: Diagnosis not present

## 2022-10-10 DIAGNOSIS — Z01419 Encounter for gynecological examination (general) (routine) without abnormal findings: Secondary | ICD-10-CM | POA: Diagnosis not present

## 2022-10-10 DIAGNOSIS — Z8241 Family history of sudden cardiac death: Secondary | ICD-10-CM | POA: Diagnosis not present

## 2022-10-10 LAB — CBC WITH DIFFERENTIAL/PLATELET
Basophils Absolute: 0.1 10*3/uL (ref 0.0–0.1)
Basophils Relative: 1.2 % (ref 0.0–3.0)
Eosinophils Absolute: 0.2 10*3/uL (ref 0.0–0.7)
Eosinophils Relative: 3.3 % (ref 0.0–5.0)
HCT: 42.1 % (ref 36.0–46.0)
Hemoglobin: 13.9 g/dL (ref 12.0–15.0)
Lymphocytes Relative: 33.5 % (ref 12.0–46.0)
Lymphs Abs: 1.7 10*3/uL (ref 0.7–4.0)
MCHC: 33 g/dL (ref 30.0–36.0)
MCV: 87 fl (ref 78.0–100.0)
Monocytes Absolute: 0.3 10*3/uL (ref 0.1–1.0)
Monocytes Relative: 5.4 % (ref 3.0–12.0)
Neutro Abs: 2.9 10*3/uL (ref 1.4–7.7)
Neutrophils Relative %: 56.6 % (ref 43.0–77.0)
Platelets: 223 10*3/uL (ref 150.0–400.0)
RBC: 4.84 Mil/uL (ref 3.87–5.11)
RDW: 12.6 % (ref 11.5–15.5)
WBC: 5.2 10*3/uL (ref 4.0–10.5)

## 2022-10-10 LAB — COMPREHENSIVE METABOLIC PANEL
ALT: 22 U/L (ref 0–35)
AST: 22 U/L (ref 0–37)
Albumin: 4.5 g/dL (ref 3.5–5.2)
Alkaline Phosphatase: 47 U/L (ref 39–117)
BUN: 18 mg/dL (ref 6–23)
CO2: 29 mEq/L (ref 19–32)
Calcium: 10 mg/dL (ref 8.4–10.5)
Chloride: 101 mEq/L (ref 96–112)
Creatinine, Ser: 0.99 mg/dL (ref 0.40–1.20)
GFR: 71.3 mL/min (ref >60.00)
Glucose, Bld: 84 mg/dL (ref 70–99)
Potassium: 4 mEq/L (ref 3.5–5.1)
Sodium: 137 mEq/L (ref 135–145)
Total Bilirubin: 0.8 mg/dL (ref 0.2–1.2)
Total Protein: 7.2 g/dL (ref 6.0–8.3)

## 2022-10-10 LAB — HM MAMMOGRAPHY

## 2022-10-10 LAB — LIPID PANEL
Cholesterol: 196 mg/dL (ref 0–200)
HDL: 58.8 mg/dL (ref >39.00)
LDL Cholesterol: 115 mg/dL — ABNORMAL HIGH (ref 0–99)
NonHDL: 137.18
Total CHOL/HDL Ratio: 3
Triglycerides: 109 mg/dL (ref 0.0–149.0)
VLDL: 21.8 mg/dL (ref 0.0–40.0)

## 2022-10-10 LAB — HEMOGLOBIN A1C: Hgb A1c MFr Bld: 5.2 % (ref 4.6–6.5)

## 2022-10-10 LAB — TSH: TSH: 1.3 u[IU]/mL (ref 0.35–5.50)

## 2022-10-10 NOTE — Patient Instructions (Signed)
It was very nice to see you today!  Calcium 600mg  twice daily and vitamin d 1000/day.   Multivitamin.    PLEASE NOTE:  If you had any lab tests please let us know if you have not heard back within a few days. You may see your results on MyChart before we have a chance to review them but we will give you a call once they are reviewed by [place]. If we ordered any referrals today, please let us know if you have not heard from their office within the next week.   Please try these tips to maintain a healthy lifestyle:  Eat most of your calories during the day when you are active. Eliminate processed foods including packaged sweets (pies, cakes, cookies), reduce intake of potatoes, white bread, white pasta, and white rice. Look for whole grain options, oat flour or almond flour.  Each meal should contain half fruits/vegetables, one quarter protein, and one quarter carbs (no bigger than a computer mouse).  Cut down on sweet beverages. This includes juice, soda, and sweet tea. Also watch fruit intake, though this is a healthier sweet option, it still contains natural sugar! Limit to 3 servings daily.  Drink at least 1 glass of water with each meal and aim for at least 8 glasses per day  Exercise at least 150 minutes every week.

## 2022-10-10 NOTE — Progress Notes (Signed)
Phone [phone]   Subjective:   Patient is a 42 y.o. female presenting for annual physical.    Chief Complaint  Patient presents with   Annual Exam    CPE Had black coffee, no food    Annual - Exercising and eating healthy.  Cholesterol - Fmhx of CAD, decided not to take Lipitor after calcium score was 0 in 02/2022. Has been working to manage her cholesterol with diet and exercise. Mom passed when 35, unsure if her mother's passing was attributed to heart disease since it was ruled "unknown". She reports her grandma had MI in her 72s and later in her 65s.   IUD - Still has IUD. No periods.  See problem oriented charting- ROS- ROS: Gen: no fever, chills  Skin: no rash, itching ENT: no ear pain, ear drainage, nasal congestion, rhinorrhea, sinus pressure, sore throat Eyes: no blurry vision, double vision Resp: no cough, wheeze,SOB CV: no CP, palpitations, LE edema,  GI: no heartburn, n/v/d/c, abd pain GU: no dysuria, urgency, frequency, hematuria MSK: no joint pain, myalgias, back pain Neuro: no dizziness, headache, weakness, vertigo Psych: no depression, anxiety, insomnia, SI   The following were reviewed and entered/updated in epic:  Past Medical History:  Diagnosis Date   Anemia    childhood   Anxiety    Depression    GERD (gastroesophageal reflux disease)    no meds   H/O varicella    Hemorrhoid    IBS (irritable bowel syndrome)    Pill esophagitis due to doxycycline 12/29/2016   Pneumonia    walking pneumonia in childhood   Postpartum care following cesarean delivery (9/30) 12/17/2013   Recurrent upper respiratory infection (URI)    Patient Active Problem List   Diagnosis Date Noted   Family history of sudden cardiac death April 10, 2019   Murmur 2019/04/10   Family history of early CAD 04/10/19   Postpartum care following cesarean delivery (10/24) 01/10/2018   R C/S 10/24 01/10/2018   Pill esophagitis due to doxycycline 12/29/2016   Past Surgical  History:  Procedure Laterality Date   addenoidectomy     CESAREAN SECTION  11/18/2011   Procedure: CESAREAN SECTION;  Surgeon: Robley Fries, MD;  Location: [facility];  Service: Gynecology;  Laterality: N/A;  Primary cesarean section of baby girl at 2013  APGAR 9/9   CESAREAN SECTION N/A 12/17/2013   Procedure: Repeat CESAREAN SECTION;  Surgeon: Robley Fries, MD;  Location: [facility];  Service: Obstetrics;  Laterality: N/A;  EDD: 12/17/13   CESAREAN SECTION N/A 01/10/2018   Procedure: Repeat CESAREAN SECTION;  Surgeon: Shea Evans, MD;  Location: [facility];  Service: Obstetrics;  Laterality: N/A;  EDD: 01/15/18  Tracey RNFA   COLONOSCOPY     42yo for ibs   DILATION AND EVACUATION  12/19/2010   Procedure: DILATATION AND EVACUATION (D&E);  Surgeon: Robley Fries;  Location: [facility];  Service: Gynecology;  Laterality: N/A;   HERNIA REPAIR  2017?   TONSILLECTOMY     UMBILICAL HERNIA REPAIR N/A 07/27/2015   Procedure:  REPAIR UMBILICAL HERNIA;  Surgeon: Harriette Bouillon, MD;  Location: [facility];  Service: General;  Laterality: N/A;   WISDOM TOOTH EXTRACTION      Family History  Problem Relation Age of Onset   Heart disease Mother 40   Heart attack Mother    Bipolar disorder Mother    Anxiety disorder Mother    Paranoid behavior Mother    Depression Mother  Early death Mother    Hyperlipidemia Father    Hypertension Father    Cancer Father        prostate   Lymphoma Father    Anxiety disorder Daughter    Cancer Maternal Grandmother        breast   Heart disease Maternal Grandmother    Coronary artery disease Maternal Grandmother    Anxiety disorder Maternal Grandmother     Medications- reviewed and updated Current Outpatient Medications  Medication Sig Dispense Refill   azelastine (ASTELIN) 0.1 % nasal spray Place into the nose as needed.     COLLAGEN PO Take by mouth.     fexofenadine (ALLEGRA) 180 MG tablet Take 180 mg by mouth daily as needed  for allergies or rhinitis.     levonorgestrel (MIRENA, 52 MG,) 20 MCG/DAY IUD 1 each by Intrauterine route once.     MAGNESIUM PO      Olopatadine HCl (PATADAY) 0.2 % SOLN 1 drop into affected eye Ophthalmic Once a day-OTC     sertraline (ZOLOFT) 50 MG tablet Take 1 tablet (50 mg total) by mouth daily. 90 tablet 3   No current facility-administered medications for this visit.    Allergies-reviewed and updated No Known Allergies  Social History   Social History Narrative   Married to [place], 3 children,1st grade teacher   Objective  Objective:  BP 124/80   Pulse (!) 55   Temp 98.3 F (36.8 C) (Temporal)   Resp 16   Ht 5\' 4"  (1.626 m)   Wt 133 lb 8 oz (60.6 kg)   SpO2 99%   BMI 22.92 kg/m  Physical Exam  Gen: WDWN NAD HEENT: NCAT, conjunctiva not injected, sclera nonicteric TM WNL B, OP moist, no exudates  NECK:  supple, no thyromegaly, no nodes, no carotid bruits CARDIAC: +Bradycardia, RRR, S1S2+, no murmur. DP 2+B LUNGS: CTAB. No wheezes ABDOMEN:  BS+, soft, NTND, No HSM, no masses EXT:  no edema MSK: no gross abnormalities. MS 5/5 all 4 NEURO: A&O x3.  CN II-XII intact.  PSYCH: normal mood. Good eye contact     Assessment and Plan   Health Maintenance counseling: 1. Anticipatory guidance: Patient counseled regarding regular dental exams q6 months, eye exams,  avoiding smoking and second hand smoke, limiting alcohol to 1 beverage per day, no illicit drugs.   2. Risk factor reduction:  Advised patient of need for regular exercise and diet rich and fruits and vegetables to reduce risk of heart attack and stroke. Exercise- Staying active and exercising regularly.  Wt Readings from Last 3 Encounters:  10/10/22 133 lb 8 oz (60.6 kg)  09/23/21 130 lb 4 oz (59.1 kg)  03/27/19 129 lb 9.6 oz (58.8 kg)   3. Immunizations/screenings/ancillary studies Immunization History  Administered Date(s) Administered   Hepatitis A 05/14/2016   Hepatitis A, Ped/Adol-2 Dose 05/14/2016    Influenza, High Dose Seasonal PF 01/28/2020   Influenza,inj,Quad PF,6+ Mos 12/18/2013   MMR 11/21/2011   Moderna Sars-Covid-2 Vaccination 01/28/2020   Tdap 11/21/2011, 09/25/2013   There are no preventive care reminders to display for this patient.   4. Cervical cancer screening- utd 5. Breast cancer screening-  mammogram UTD. Had mammogram today.  6. Colon cancer screening - n/a 7. Skin cancer screening- advised regular sunscreen use. Denies worrisome, changing, or new skin lesions.  8. Birth control/STD check- Has IUD  9. Osteoporosis screening- n/a 10. Smoking associated screening - non smoker  Wellness examination -  CBC with Differential/Platelet -     Comprehensive metabolic panel -     Lipid panel -     TSH -     Hemoglobin A1c  Family history of sudden cardiac death  Wellness-anticipatory guidance.  Work on Diet/Exercise  Check CBC,CMP,lipids,TSH, A1C.  F/u 1 yr  RHM UTD FH CAD-calcium score 0 so working on diet/exercise.   Recommended follow up: Return in about 1 year (around 10/10/2023) for annual physical.  Lab/Order associations: +fasting   I,Rachel Rivera,acting as a scribe for Angelena Sole, MD.,have documented all relevant documentation on the behalf of Angelena Sole, MD,as directed by  Angelena Sole, MD while in the presence of Angelena Sole, MD.  I, Angelena Sole, MD, have reviewed all documentation for this visit. The documentation on 10/10/22 for the exam, diagnosis, procedures, and orders are all accurate and complete.   Angelena Sole, MD

## 2022-10-11 NOTE — Progress Notes (Signed)
Labs stable.  I can't recommend for or against going on the statin.

## 2022-11-28 ENCOUNTER — Telehealth: Payer: Self-pay | Admitting: Psychiatry

## 2022-11-28 ENCOUNTER — Encounter: Payer: Self-pay | Admitting: Family Medicine

## 2022-11-28 DIAGNOSIS — F411 Generalized anxiety disorder: Secondary | ICD-10-CM

## 2022-11-28 MED ORDER — SERTRALINE HCL 50 MG PO TABS
75.0000 mg | ORAL_TABLET | Freq: Every day | ORAL | Status: DC
Start: 2022-11-28 — End: 2023-02-09

## 2022-11-28 NOTE — Telephone Encounter (Signed)
Patient asking about increasing sertraline. It was started in 2021 and she feels that it isn't working as well as it did initially. She is reporting the physical sx she had prior to starting the sertraline, such as palpitations; also reporting irritability. She reports her sister has moved closer to her and that it isn't the best situation. She reports stress with life in general.  No issues with sleep.

## 2022-11-28 NOTE — Telephone Encounter (Signed)
Recommend increasing Sertraline to 1.5 tabs daily since she initially had such significant improvement with low dose Sertraline. Please ask that she call back with an update or schedule an apt in 4 weeks to determine if she should continue 75 mg or increase to 100 mg daily.

## 2022-11-28 NOTE — Telephone Encounter (Signed)
Pt LVM @ 4:40p asking for Jessica's thoughts on increasing her Sertraline.  Next appt 5/15

## 2022-11-29 NOTE — Telephone Encounter (Signed)
LVM with info per DPR.  

## 2022-12-07 DIAGNOSIS — F4320 Adjustment disorder, unspecified: Secondary | ICD-10-CM | POA: Diagnosis not present

## 2023-01-29 ENCOUNTER — Encounter: Payer: Self-pay | Admitting: Family Medicine

## 2023-01-31 ENCOUNTER — Encounter: Payer: Self-pay | Admitting: Psychiatry

## 2023-02-05 ENCOUNTER — Other Ambulatory Visit: Payer: Self-pay

## 2023-02-05 DIAGNOSIS — E7841 Elevated Lipoprotein(a): Secondary | ICD-10-CM

## 2023-02-05 NOTE — Telephone Encounter (Signed)
Noted  

## 2023-02-05 NOTE — Telephone Encounter (Signed)
Patient has been scheduled for 11/27 @ 9:15 for fasting labs.

## 2023-02-09 ENCOUNTER — Telehealth: Payer: Self-pay | Admitting: Psychiatry

## 2023-02-09 DIAGNOSIS — F411 Generalized anxiety disorder: Secondary | ICD-10-CM

## 2023-02-09 MED ORDER — SERTRALINE HCL 50 MG PO TABS
75.0000 mg | ORAL_TABLET | Freq: Every day | ORAL | 0 refills | Status: DC
Start: 2023-02-09 — End: 2023-08-20

## 2023-02-09 NOTE — Telephone Encounter (Signed)
Patient lvm stating per Shanda Bumps she increased her Zoloft. Due to the increase a new written Rx is needed for her refill. Appointment scheduled for 08/02/23.

## 2023-02-09 NOTE — Telephone Encounter (Signed)
Rx sent to [facility]

## 2023-02-14 ENCOUNTER — Other Ambulatory Visit: Payer: BC Managed Care – PPO

## 2023-04-24 ENCOUNTER — Ambulatory Visit: Payer: Self-pay | Admitting: Family Medicine

## 2023-04-24 NOTE — Telephone Encounter (Signed)
 1st attempt, LMOM advising pt return call to office. Routing to call back.  Copied from CRM [id number]. Topic: Clinical - Medication Question >> Apr 24, 2023  5:18 PM Laurier C wrote: Reason for CRM: Patient states all her children tested positive for the flu and was wanting to know if Tami Flu could be called in for preventive measures.

## 2023-04-24 NOTE — Telephone Encounter (Signed)
 Routing to provider for review.  Reason for Disposition  [1] Caller requesting NON-URGENT health information AND [2] PCP's office is the best resource  Answer Assessment - Initial Assessment Questions 1. REASON FOR CALL or QUESTION: What is your reason for calling today? or How can I best help you? or What question do you have that I can help answer?     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 She is currently asymptomatic and is requesting it as a preventative.  Protocols used: Information Only Call - No Triage-A-AH

## 2023-04-25 ENCOUNTER — Ambulatory Visit: Payer: Self-pay | Admitting: Family Medicine

## 2023-04-25 ENCOUNTER — Other Ambulatory Visit: Payer: Self-pay

## 2023-04-25 ENCOUNTER — Telehealth: Payer: Self-pay

## 2023-04-25 MED ORDER — OSELTAMIVIR PHOSPHATE 75 MG PO CAPS: 75.0000 mg | ORAL_CAPSULE | Freq: Every day | ORAL | 0 refills | Status: AC

## 2023-04-25 NOTE — Telephone Encounter (Signed)
 Rx sent to pharmacy

## 2023-04-25 NOTE — Telephone Encounter (Signed)
 Please see patient request and advise.

## 2023-04-25 NOTE — Telephone Encounter (Signed)
 Copied from CRM [id number]. Topic: Clinical - Prescription Issue >> Apr 25, 2023  9:39 AM Leotis ORN wrote: Reason for CRM: patient calling in regards to her request for tamiflu , she stated her husbands rx is ready, but has not heard back about hers and is wanting to know if it will be sent in today. Patient call back [phone]  Duplicate request for Tamiflu  prescription.  Routed to provider 04/24/23.  Awaiting response.  Reason for Disposition  Prescription request for new medicine (not a refill)  Protocols used: Medication Question Call-A-AH

## 2023-04-25 NOTE — Telephone Encounter (Signed)
 Copied from CRM [id number]. Topic: Clinical - Medication Question >> Apr 24, 2023  5:39 PM Tiffany H wrote: Patient called back after close. Declined Triage RN intervention. Advised she's fine to wait for Tamiflu  or callback in the morning. Please assist.   Message sent to provider, this is a duplicate request, nothing further needed at this time

## 2023-04-25 NOTE — Telephone Encounter (Signed)
 Please see message below and advise.

## 2023-06-12 DIAGNOSIS — F4320 Adjustment disorder, unspecified: Secondary | ICD-10-CM | POA: Diagnosis not present

## 2023-07-12 DIAGNOSIS — F4320 Adjustment disorder, unspecified: Secondary | ICD-10-CM | POA: Diagnosis not present

## 2023-08-02 ENCOUNTER — Ambulatory Visit: Payer: BC Managed Care – PPO | Admitting: Psychiatry

## 2023-08-20 ENCOUNTER — Ambulatory Visit (INDEPENDENT_AMBULATORY_CARE_PROVIDER_SITE_OTHER): Admitting: Physician Assistant

## 2023-08-20 ENCOUNTER — Encounter: Payer: Self-pay | Admitting: Physician Assistant

## 2023-08-20 VITALS — BP 147/98 | HR 61

## 2023-08-20 DIAGNOSIS — F902 Attention-deficit hyperactivity disorder, combined type: Secondary | ICD-10-CM | POA: Diagnosis not present

## 2023-08-20 DIAGNOSIS — F411 Generalized anxiety disorder: Secondary | ICD-10-CM

## 2023-08-20 MED ORDER — SERTRALINE HCL 50 MG PO TABS: 75.0000 mg | ORAL_TABLET | Freq: Every day | ORAL | 0 refills | Status: DC

## 2023-08-20 MED ORDER — GUANFACINE HCL ER 1 MG PO TB24: 1.0000 mg | ORAL_TABLET | Freq: Every day | ORAL | 1 refills | Status: DC

## 2023-08-20 NOTE — Progress Notes (Unsigned)
 [facility]  Patient ID: Stephanie Harrington,  MRN: [id number]  PCP: Christel Cousins, MD  Date of Evaluation: 08/23/2023 Time spent:20 minutes  Chief Complaint:  Chief Complaint   Anxiety; Follow-up    HISTORY/CURRENT STATUS: HPI  For routine med check.  Transferring to my care from Roselyn Connor, NP who is no longer with the practice  She is doing well as far as her mood goes. Patient is able to enjoy things.  Energy and motivation are good.  Work is going well.  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  No extreme sadness, tearfulness, or feelings of hopelessness.  Sleeps well most of the time. ADLs and personal hygiene are normal.  Appetite has not changed.  Weight is stable.  No complaints of anxiety.  Denies suicidal or homicidal thoughts.  She wonders whether she has ADHD.  One of her children has it.  She cannot remember things.  She is not organized, she starts numerous projects without finishing others. She did very well in school but did not have to study or keep up with things because of her intelligence.  Patient denies increased energy with decreased need for sleep, increased talkativeness, racing thoughts, impulsivity or risky behaviors, increased spending, increased libido, grandiosity, increased irritability or anger, paranoia, or hallucinations.  Denies dizziness, syncope, seizures, numbness, tingling, tremor, tics, unsteady gait, slurred speech, confusion. Denies muscle or joint pain, stiffness, or dystonia.  Individual Medical History/ Review of Systems: Changes? :No   Past medications for mental health diagnoses include: Zoloft   Allergies: Patient has no known allergies.  Current Medications:  Current Outpatient Medications:    azelastine (ASTELIN) 0.1 % nasal spray, Place into the nose as needed., Disp: , Rfl:    Calcium -Vitamins C & D (CALCIUM /C/D PO), Take by mouth., Disp: , Rfl:    COLLAGEN PO, Take by mouth., Disp: , Rfl:    fexofenadine (ALLEGRA) 180 MG  tablet, Take 180 mg by mouth daily as needed for allergies or rhinitis., Disp: , Rfl:    guanFACINE (INTUNIV) 1 MG TB24 ER tablet, Take 1 tablet (1 mg total) by mouth daily., Disp: 30 tablet, Rfl: 1   levonorgestrel (MIRENA, 52 MG,) 20 MCG/DAY IUD, 1 each by Intrauterine route once., Disp: , Rfl:    MAGNESIUM PO, , Disp: , Rfl:    Multiple Vitamin (MULTIVITAMIN PO), Take by mouth., Disp: , Rfl:    Olopatadine HCl (PATADAY) 0.2 % SOLN, 1 drop into affected eye Ophthalmic Once a day-OTC, Disp: , Rfl:    Omega-3 Fatty Acids (FISH OIL PO), Take by mouth., Disp: , Rfl:    sertraline  (ZOLOFT ) 50 MG tablet, Take 1.5 tablets (75 mg total) by mouth daily., Disp: 45 tablet, Rfl: 0   sertraline  (ZOLOFT ) 50 MG tablet, Take 1.5 tablets (75 mg total) by mouth daily., Disp: 135 tablet, Rfl: 0 Medication Side Effects: none  Family Medical/ Social History: Changes? No  MENTAL HEALTH EXAM:  Blood pressure (!) 147/98, pulse 61.There is no height or weight on file to calculate BMI.  General Appearance: Casual and Well Groomed  Eye Contact:  Good  Speech:  Clear and Coherent and Normal Rate  Volume:  Normal  Mood:  Euthymic  Affect:  Congruent  Thought Process:  Goal Directed and Descriptions of Associations: Circumstantial  Orientation:  Full (Time, Place, and Person)  Thought Content: Logical   Suicidal Thoughts:  No  Homicidal Thoughts:  No  Memory:  WNL  Judgement:  Good  Insight:  Good  Psychomotor Activity:  Normal  Concentration:  Concentration: Good and Attention Span: Good  Recall:  Good  Fund of Knowledge: Good  Language: Good  Assets:  Communication Skills Desire for Improvement Financial Resources/Insurance Housing Transportation Vocational/Educational  ADL's:  Intact  Cognition: WNL  Prognosis:  Good   DIAGNOSES:    ICD-10-CM   1. Attention deficit hyperactivity disorder (ADHD), combined type  F90.2     2. Generalized anxiety disorder  F41.1 sertraline  (ZOLOFT ) 50 MG tablet      Receiving Psychotherapy: No   RECOMMENDATIONS:   PDMP reviewed.  No controlled substances. I provided 20 minutes of face to face time during this encounter, including time spent before and after the visit in records review, medical decision making, counseling pertinent to today's visit, and charting.   It does sound as though she has ADHD that has been undiagnosed.  Treatment options were discussed including stimulants, clonidine or guanfacine, Strattera or Quelbree.  Pros and cons of each group and drug were discussed.  I would be concerned that the anxiety would worsen with a stimulant.  We decided it is best to try Intuniv initially.  That should help her sleep and help with relaxation as well as the ADHD.  She would like to try it.  Her blood pressure is slightly elevated.  I recommend she check it several times a week and if it is staying over 140/90 then she should discuss it with her PCP.  Reports having elevated BPs in the medical setting sometimes so it is not very concerning.  Start Intuniv 1 mg, 1 p.o. nightly. Continue Zoloft  50 mg, 1.5 pills daily. Return in 6 to 8 weeks.  Marvia Slocumb, PA-C

## 2023-09-26 DIAGNOSIS — L570 Actinic keratosis: Secondary | ICD-10-CM | POA: Diagnosis not present

## 2023-09-29 ENCOUNTER — Other Ambulatory Visit: Payer: Self-pay | Admitting: Physician Assistant

## 2023-10-01 ENCOUNTER — Ambulatory Visit (INDEPENDENT_AMBULATORY_CARE_PROVIDER_SITE_OTHER): Admitting: Physician Assistant

## 2023-10-01 ENCOUNTER — Encounter: Payer: Self-pay | Admitting: Physician Assistant

## 2023-10-01 VITALS — BP 116/76 | HR 47

## 2023-10-01 DIAGNOSIS — F902 Attention-deficit hyperactivity disorder, combined type: Secondary | ICD-10-CM | POA: Diagnosis not present

## 2023-10-01 DIAGNOSIS — F411 Generalized anxiety disorder: Secondary | ICD-10-CM

## 2023-10-01 MED ORDER — GUANFACINE HCL ER 1 MG PO TB24: 1.0000 mg | ORAL_TABLET | Freq: Every day | ORAL | 1 refills | Status: DC

## 2023-10-01 MED ORDER — SERTRALINE HCL 50 MG PO TABS: 50.0000 mg | ORAL_TABLET | Freq: Every day | ORAL | 1 refills | Status: DC

## 2023-10-01 NOTE — Progress Notes (Signed)
 [facility]  Patient ID: Stephanie Harrington,  MRN: [id number]  PCP: Wendolyn Jenkins Jansky, MD  Date of Evaluation: 10/01/2023 Time spent:20 minutes  Chief Complaint:  Chief Complaint   Anxiety; ADD; Follow-up    HISTORY/CURRENT STATUS: HPI  For routine med check.    Intuniv  was added at LOV.  It has been helpful for focus and concentration. Initially caused irritability but that's improved. Still gets a little sleepy from it but it's controllable. Happy with the results.   Patient is able to enjoy things. 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   No extreme sadness, tearfulness, or feelings of hopelessness.  Sleeps well most of the time. ADLs and personal hygiene are normal.  Appetite has not changed.  Weight is stable. No mania, delirium or psychosis.  No SI/HI.   Denies dizziness, syncope, seizures, numbness, tingling, tremor, tics, unsteady gait, slurred speech, confusion. Denies muscle or joint pain, stiffness, or dystonia.  Individual Medical History/ Review of Systems: Changes? :No   Past medications for mental health diagnoses include: Zoloft   Allergies: Patient has no known allergies.  Current Medications:  Current Outpatient Medications:    Calcium -Vitamins C & D (CALCIUM /C/D PO), Take by mouth., Disp: , Rfl:    COLLAGEN PO, Take by mouth., Disp: , Rfl:    levonorgestrel (MIRENA, 52 MG,) 20 MCG/DAY IUD, 1 each by Intrauterine route once., Disp: , Rfl:    MAGNESIUM PO, , Disp: , Rfl:    Multiple Vitamin (MULTIVITAMIN PO), Take by mouth., Disp: , Rfl:    Omega-3 Fatty Acids (FISH OIL PO), Take by mouth., Disp: , Rfl:    azelastine (ASTELIN) 0.1 % nasal spray, Place into the nose as needed. (Patient not taking: Reported on 10/01/2023), Disp: , Rfl:    fexofenadine (ALLEGRA) 180 MG tablet, Take 180 mg by mouth daily as needed for allergies or rhinitis. (Patient not taking: Reported on 10/01/2023), Disp: , Rfl:    guanFACINE  (INTUNIV ) 1 MG TB24 ER tablet, Take 1  tablet (1 mg total) by mouth daily., Disp: 90 tablet, Rfl: 1   Olopatadine HCl (PATADAY) 0.2 % SOLN, 1 drop into affected eye Ophthalmic Once a day-OTC (Patient not taking: Reported on 10/01/2023), Disp: , Rfl:    sertraline  (ZOLOFT ) 50 MG tablet, Take 1 tablet (50 mg total) by mouth daily., Disp: 90 tablet, Rfl: 1 Medication Side Effects: none  Family Medical/ Social History: Changes? No  MENTAL HEALTH EXAM:  Blood pressure 116/76, pulse (!) 47.There is no height or weight on file to calculate BMI.  General Appearance: Casual and Well Groomed  Eye Contact:  Good  Speech:  Clear and Coherent and Normal Rate  Volume:  Normal  Mood:  Euthymic  Affect:  Congruent  Thought Process:  Goal Directed and Descriptions of Associations: Circumstantial  Orientation:  Full (Time, Place, and Person)  Thought Content: Logical   Suicidal Thoughts:  No  Homicidal Thoughts:  No  Memory:  WNL  Judgement:  Good  Insight:  Good  Psychomotor Activity:  Normal  Concentration:  Concentration: Good and Attention Span: Good  Recall:  Good  Fund of Knowledge: Good  Language: Good  Assets:  Communication Skills Desire for Improvement Financial Resources/Insurance Housing Resilience Transportation Vocational/Educational  ADL's:  Intact  Cognition: WNL  Prognosis:  Good   DIAGNOSES:    ICD-10-CM   1. Attention deficit hyperactivity disorder (ADHD), combined type  F90.2     2. Generalized anxiety disorder  F41.1 sertraline  (ZOLOFT )  50 MG tablet     Receiving Psychotherapy: No   RECOMMENDATIONS:   PDMP reviewed.  No controlled substances. I provided approximately 20 minutes of face to face time during this encounter, including time spent before and after the visit in records review, medical decision making, counseling pertinent to today's visit, and charting.   Her pulse is low but she is asymptomatic.  She is a runner and states her pulse is always low.  She is checking her blood pressure and  Pulse regularly at home and is not unusual for the pulse rate to be in the 50s.  Looking back at past vital signs available in the chart also show low pulse rates.  She is doing well on the Intuniv  so no changes will be made.  She had ask about whether it may be feasible for her to get off the Zoloft  because she is on it to help anxiety and the Intuniv  is helping with that as well.  We can try that at some point but agree making a major change is not a good idea at this time and with life circumstances.  We agreed to decrease the dose to 50 mg and see how she does and then readdress at her next visit.  Continue Intuniv  1 mg, 1 p.o. nightly. Decrease Zoloft  50 mg, to 1 p.o. daily.   Return in Dec. 2025.    Verneita Cooks, PA-C

## 2023-10-16 ENCOUNTER — Ambulatory Visit (INDEPENDENT_AMBULATORY_CARE_PROVIDER_SITE_OTHER): Payer: BC Managed Care – PPO | Admitting: Family Medicine

## 2023-10-16 ENCOUNTER — Ambulatory Visit: Payer: Self-pay | Admitting: Family Medicine

## 2023-10-16 ENCOUNTER — Encounter: Payer: Self-pay | Admitting: Family Medicine

## 2023-10-16 VITALS — BP 116/62 | HR 69 | Temp 99.3°F | Resp 18 | Ht 64.0 in | Wt 141.2 lb

## 2023-10-16 DIAGNOSIS — Z0000 Encounter for general adult medical examination without abnormal findings: Secondary | ICD-10-CM | POA: Diagnosis not present

## 2023-10-16 LAB — CBC WITH DIFFERENTIAL/PLATELET
Basophils Absolute: 0.1 K/uL (ref 0.0–0.1)
Basophils Relative: 1.1 % (ref 0.0–3.0)
Eosinophils Absolute: 0.1 K/uL (ref 0.0–0.7)
Eosinophils Relative: 2.9 % (ref 0.0–5.0)
HCT: 41.9 % (ref 36.0–46.0)
Hemoglobin: 14.3 g/dL (ref 12.0–15.0)
Lymphocytes Relative: 29.8 % (ref 12.0–46.0)
Lymphs Abs: 1.4 K/uL (ref 0.7–4.0)
MCHC: 34.1 g/dL (ref 30.0–36.0)
MCV: 84.3 fl (ref 78.0–100.0)
Monocytes Absolute: 0.3 K/uL (ref 0.1–1.0)
Monocytes Relative: 6.4 % (ref 3.0–12.0)
Neutro Abs: 2.8 K/uL (ref 1.4–7.7)
Neutrophils Relative %: 59.8 % (ref 43.0–77.0)
Platelets: 213 K/uL (ref 150.0–400.0)
RBC: 4.97 Mil/uL (ref 3.87–5.11)
RDW: 12.6 % (ref 11.5–15.5)
WBC: 4.6 K/uL (ref 4.0–10.5)

## 2023-10-16 LAB — LIPID PANEL
Cholesterol: 201 mg/dL — ABNORMAL HIGH (ref 0–200)
HDL: 49.8 mg/dL (ref >39.00)
LDL Cholesterol: 123 mg/dL — ABNORMAL HIGH (ref 0–99)
NonHDL: 151.43
Total CHOL/HDL Ratio: 4
Triglycerides: 142 mg/dL (ref 0.0–149.0)
VLDL: 28.4 mg/dL (ref 0.0–40.0)

## 2023-10-16 LAB — COMPREHENSIVE METABOLIC PANEL WITH GFR
ALT: 22 U/L (ref 0–35)
AST: 20 U/L (ref 0–37)
Albumin: 4.8 g/dL (ref 3.5–5.2)
Alkaline Phosphatase: 53 U/L (ref 39–117)
BUN: 22 mg/dL (ref 6–23)
CO2: 27 meq/L (ref 19–32)
Calcium: 9.9 mg/dL (ref 8.4–10.5)
Chloride: 103 meq/L (ref 96–112)
Creatinine, Ser: 0.93 mg/dL (ref 0.40–1.20)
GFR: 76.31 mL/min (ref >60.00)
Glucose, Bld: 85 mg/dL (ref 70–99)
Potassium: 4.3 meq/L (ref 3.5–5.1)
Sodium: 139 meq/L (ref 135–145)
Total Bilirubin: 0.4 mg/dL (ref 0.2–1.2)
Total Protein: 7 g/dL (ref 6.0–8.3)

## 2023-10-16 LAB — TSH: TSH: 1.03 u[IU]/mL (ref 0.35–5.50)

## 2023-10-16 LAB — HEMOGLOBIN A1C: Hgb A1c MFr Bld: 5.3 % (ref 4.6–6.5)

## 2023-10-16 NOTE — Patient Instructions (Addendum)
 It was very nice to see you today!  B complex vitamin.      PLEASE NOTE:  If you had any lab tests please let us  know if you have not heard back within a few days. You may see your results on MyChart before we have a chance to review them but we will give you a call once they are reviewed by us . If we ordered any referrals today, please let us  know if you have not heard from their office within the next week.   Please try these tips to maintain a healthy lifestyle:  Eat most of your calories during the day when you are active. Eliminate processed foods including packaged sweets (pies, cakes, cookies), reduce intake of potatoes, white bread, white pasta, and white rice. Look for whole grain options, oat flour or almond flour.  Each meal should contain half fruits/vegetables, one quarter protein, and one quarter carbs (no bigger than a computer mouse).  Cut down on sweet beverages. This includes juice, soda, and sweet tea. Also watch fruit intake, though this is a healthier sweet option, it still contains natural sugar! Limit to 3 servings daily.  Drink at least 1 glass of water with each meal and aim for at least 8 glasses per day  Exercise at least 150 minutes every week.

## 2023-10-16 NOTE — Progress Notes (Signed)
 Labs great/at goal

## 2023-10-16 NOTE — Progress Notes (Signed)
 Phone [phone]   Subjective:   Patient is a 43 y.o. female presenting for annual physical.    Chief Complaint  Patient presents with   Annual Exam    CPE Had yogurt Pap scheduled for August with gyn   Annual-exercising Discussed the use of AI scribe software for clinical note transcription with the patient, who gave verbal consent to proceed.  History of Present Illness Stephanie Harrington is a 43 year old female who presents with concerns about weight gain and potential hormonal changes.  She has experienced a weight gain from 130 pounds in July 2023 to 141 pounds currently, despite maintaining a healthy diet and regular exercise routine, including running and strength training.  She has a history of two failed IVF attempts and was informed at that time that she was 'basically menopausal.' Her youngest child is 50 years old, and she is concerned about the possibility of being menopausal at 64, given her past fertility challenges.  She recently started taking guanfacine  for symptoms of ADD, which she feels has been somewhat effective. She has expressed concern that hormonal changes may be affecting her weight and overall health.  She has a family history of heart disease, with her mother having died from it. She previously took atorvastatin  but stopped shortly after starting it in 2023. Her LDL was 115 last year, which is a concern due to her family history.  No major headaches, dizziness, syncope, blurry vision, diplopia, sore throat, hoarseness, dysphagia, chest pain, palpitations, cough, wheezing, dyspnea, vomiting, diarrhea, constipation, hematochezia, heartburn, dysuria, myalgias, arthralgias, depression, or suicidal ideation.    See problem oriented charting- ROS- ROS: Gen: no fever, chills  Skin: no rash, itching ENT: no ear pain, ear drainage, nasal congestion, rhinorrhea, sinus pressure, sore throat Eyes: no blurry vision, double vision Resp: no cough, wheeze,SOB CV: no  CP, palpitations, LE edema,  GI: no heartburn, n/v/d/c, abd pain GU: no dysuria, urgency, frequency, hematuria MSK: no joint pain, myalgias, back pain Neuro: no dizziness, headache, weakness, vertigo Psych: no depression, anxiety, insomnia, SI   The following were reviewed and entered/updated in epic: Past Medical History:  Diagnosis Date   Anemia    childhood   Anxiety    Depression    GERD (gastroesophageal reflux disease)    no meds   H/O varicella    Hemorrhoid    IBS (irritable bowel syndrome)    Pill esophagitis due to doxycycline  12/29/2016   Pneumonia    walking pneumonia in childhood   Postpartum care following cesarean delivery (9/30) 12/17/2013   Recurrent upper respiratory infection (URI)    Patient Active Problem List   Diagnosis Date Noted   Family history of sudden cardiac death 03/28/19   Murmur 03-28-2019   Family history of early CAD March 28, 2019   Postpartum care following cesarean delivery (10/24) 01/10/2018   R C/S 10/24 01/10/2018   Pill esophagitis due to doxycycline  12/29/2016   Past Surgical History:  Procedure Laterality Date   addenoidectomy     CESAREAN SECTION  11/18/2011   Procedure: CESAREAN SECTION;  Surgeon: Robbi JONELLE Render, MD;  Location: [facility];  Service: Gynecology;  Laterality: N/A;  Primary cesarean section of baby girl at 2013  APGAR 9/9   CESAREAN SECTION N/A 12/17/2013   Procedure: Repeat CESAREAN SECTION;  Surgeon: Robbi JONELLE Render, MD;  Location: [facility];  Service: Obstetrics;  Laterality: N/A;  EDD: 12/17/13   CESAREAN SECTION N/A 01/10/2018   Procedure: Repeat CESAREAN SECTION;  Surgeon: Render Robbi,  MD;  Location: [facility];  Service: Obstetrics;  Laterality: N/A;  EDD: 01/15/18  Tracey RNFA   COLONOSCOPY     43yo for ibs   DILATION AND EVACUATION  12/19/2010   Procedure: DILATATION AND EVACUATION (D&E);  Surgeon: Robbi JONELLE Render;  Location: [facility];  Service: Gynecology;  Laterality: N/A;   HERNIA REPAIR  2017?    TONSILLECTOMY     UMBILICAL HERNIA REPAIR N/A 07/27/2015   Procedure:  REPAIR UMBILICAL HERNIA;  Surgeon: Debby Shipper, MD;  Location: [facility];  Service: General;  Laterality: N/A;   WISDOM TOOTH EXTRACTION      Family History  Problem Relation Age of Onset   Heart disease Mother 88   Heart attack Mother    Bipolar disorder Mother    Anxiety disorder Mother    Paranoid behavior Mother    Depression Mother    Early death Mother    Hyperlipidemia Father    Hypertension Father    Cancer Father        prostate   Lymphoma Father    Anxiety disorder Daughter    Cancer Maternal Grandmother        breast   Heart disease Maternal Grandmother    Coronary artery disease Maternal Grandmother    Anxiety disorder Maternal Grandmother     Medications- reviewed and updated Current Outpatient Medications  Medication Sig Dispense Refill   Calcium -Vitamins C & D (CALCIUM /C/D PO) Take by mouth.     COLLAGEN PO Take by mouth.     guanFACINE  (INTUNIV ) 1 MG TB24 ER tablet Take 1 tablet (1 mg total) by mouth daily. 90 tablet 1   levonorgestrel (MIRENA, 52 MG,) 20 MCG/DAY IUD 1 each by Intrauterine route once.     MAGNESIUM PO      Multiple Vitamin (MULTIVITAMIN PO) Take by mouth.     Omega-3 Fatty Acids (FISH OIL PO) Take by mouth.     sertraline  (ZOLOFT ) 50 MG tablet Take 1 tablet (50 mg total) by mouth daily. 90 tablet 1   No current facility-administered medications for this visit.    Allergies-reviewed and updated No Known Allergies  Social History   Social History Narrative   Married to [place], 3 children,1st grade teacher   Objective  Objective:  BP 116/62   Pulse 69   Temp 99.3 F (37.4 C) (Temporal)   Resp 18   Ht 5' 4 (1.626 m)   Wt 141 lb 4 oz (64.1 kg)   SpO2 99%   BMI 24.25 kg/m  Physical Exam  Gen: WDWN NAD HEENT: NCAT, conjunctiva not injected, sclera nonicteric TM WNL B, OP moist, no exudates  NECK:  supple, no thyromegaly, no nodes, no  carotid bruits CARDIAC: RRR, S1S2+, no murmur. DP 2+B LUNGS: CTAB. No wheezes ABDOMEN:  BS+, soft, NTND, No HSM, no masses EXT:  no edema MSK: no gross abnormalities. MS 5/5 all 4 NEURO: A&O x3.  CN II-XII intact.  PSYCH: normal mood. Good eye contact     Assessment and Plan   Health Maintenance counseling: 1. Anticipatory guidance: Patient counseled regarding regular dental exams q6 months, eye exams,  avoiding smoking and second hand smoke, limiting alcohol to 1 beverage per day, no illicit drugs.   2. Risk factor reduction:  Advised patient of need for regular exercise and diet rich and fruits and vegetables to reduce risk of heart attack and stroke. Exercise- +.  Wt Readings from Last 3 Encounters:  10/16/23 141 lb 4  oz (64.1 kg)  10/10/22 133 lb 8 oz (60.6 kg)  09/23/21 130 lb 4 oz (59.1 kg)   3. Immunizations/screenings/ancillary studies Immunization History  Administered Date(s) Administered   Hepatitis A 05/14/2016   Hepatitis A, Ped/Adol-2 Dose 05/14/2016   Influenza, High Dose Seasonal PF 01/28/2020   Influenza,inj,Quad PF,6+ Mos 12/18/2013   MMR 11/21/2011   Moderna Sars-Covid-2 Vaccination 01/28/2020   Tdap 11/21/2011, 09/25/2013, 10/24/2017   There are no preventive care reminders to display for this patient.   4. Cervical cancer screening- gyn 5. Breast cancer screening-  mammogram gyn 6. Colon cancer screening - n/a 7. Skin cancer screening- advised regular sunscreen use. Denies worrisome, changing, or new skin lesions.  8. Birth control/STD check- iud 9. Osteoporosis screening- n/a 10. Smoking associated screening - non smoker  Wellness examination   Wellness-anticipatory guidance.  Work on Diet/Exercise  Check CBC,CMP,lipids,TSH, A1C.  F/u 1 yr  Assessment and Plan Assessment & Plan Abnormal weight gain in perimenopausal female   She has gained 10 lbs over the past two years, likely due to perimenopausal hormonal changes. Despite a healthy diet and  exercise, her weight remains unchanged. The Mirena IUD may affect hormone levels, precluding hormone testing. Incorporate strength training into her exercise routine. Consider B complex vitamins for potential benefits. Discuss with GYN regarding menopausal status and IUD management. Consider trying inositol supplement and monitor for hypoglycemia.  Hyperlipidemia   She has hyperlipidemia with previous atorvastatin  use discontinued. Her last LDL was 115 mg/dL, which is acceptable without other risk factors such as hypertension or diabetes. There is a family history of heart disease. Perform blood work to assess current lipid levels.  Attention and concentration deficit   She is taking guanfacine . Challenges with multitasking and lifestyle impact on attention are noted.  General Health Maintenance   Emphasis is placed on maintaining a healthy lifestyle, including diet, exercise, and routine health practices. Safety measures such as wearing seatbelts, avoiding drinking and driving, and using sunscreen were discussed. Regular dental visits are encouraged. Continue healthy diet and exercise. Wear seatbelt, avoid drinking and driving, use sunscreen. Continue regular dental visits.    Recommended follow up: Return in about 1 year (around 10/15/2024) for annual physical.  Lab/Order associations:no fasting  Jenkins CHRISTELLA Carrel, MD

## 2023-12-01 ENCOUNTER — Other Ambulatory Visit: Payer: Self-pay | Admitting: Physician Assistant

## 2023-12-01 DIAGNOSIS — F411 Generalized anxiety disorder: Secondary | ICD-10-CM

## 2023-12-04 ENCOUNTER — Encounter: Payer: Self-pay | Admitting: Family Medicine

## 2023-12-04 ENCOUNTER — Telehealth (INDEPENDENT_AMBULATORY_CARE_PROVIDER_SITE_OTHER): Admitting: Family Medicine

## 2023-12-04 VITALS — Ht 64.0 in | Wt 137.0 lb

## 2023-12-04 DIAGNOSIS — R3989 Other symptoms and signs involving the genitourinary system: Secondary | ICD-10-CM

## 2023-12-04 MED ORDER — NITROFURANTOIN MONOHYD MACRO 100 MG PO CAPS: 100.0000 mg | ORAL_CAPSULE | Freq: Two times a day (BID) | ORAL | 0 refills | Status: AC

## 2023-12-04 NOTE — Patient Instructions (Signed)
Please follow up if symptoms do not improve or as needed.   

## 2023-12-04 NOTE — Progress Notes (Signed)
 Virtual Visit via Video Note  Subjective  CC:  Chief Complaint  Patient presents with   Urinary Frequency    Along with some burning x2     I connected with Stephanie Harrington on 12/04/23 at 11:30 AM EDT by a video enabled telemedicine application and verified that I am speaking with the correct person using two identifiers. Location patient: Home Location provider: [facility] [address] Office Persons participating in the virtual visit: CASHAE WEICH, Lavern LITTIE Heck, MD Avelina Berber CMA  I discussed the limitations of evaluation and management by telemedicine and the availability of in person appointments. The patient expressed understanding and agreed to proceed. HPI: Stephanie Harrington is a 43 y.o. female who was contacted today to address the problems listed above in the chief complaint. 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  Therefore telehealth visit.  She reports 2-day history of classic UTI symptoms.  Describes dysuria, urinary frequency, urgency without gross hematuria, pelvic pain, vaginal discharge, fevers, nausea vomiting or flank pain.  She does not have a history of recurrent UTIs although she typically will get 1 once a year.  Last was about a year ago.  Assessment  1. Suspected UTI      Plan  Clinical UTI: Discussed her symptoms are consistent with UTI but cannot be proven without urinary assessment.  Healthy low risk patient.  Will treat empirically with Macrobid  100 mg twice daily for 3 to 5 days.  If symptoms are not improving in 48 to 72 hours, she will return for evaluation.  Discussed risk versus benefits.  Patient agrees with care plan. I discussed the assessment and treatment plan with the patient. The patient was provided an opportunity to ask questions and all were answered. The patient agreed with the plan and demonstrated an  understanding of the instructions.   The patient was advised to call back or seek an in-person evaluation if the symptoms worsen or if the condition fails to improve as anticipated. Follow up: If not improving Visit date not found  Meds ordered this encounter  Medications   nitrofurantoin , macrocrystal-monohydrate, (MACROBID ) 100 MG capsule    Sig: Take 1 capsule (100 mg total) by mouth 2 (two) times daily.    Dispense:  10 capsule    Refill:  0      I reviewed the patients updated PMH, FH, and SocHx.    Patient Active Problem List   Diagnosis Date Noted   Family history of sudden cardiac death Apr 09, 2019   Murmur 09-Apr-2019   Family history of early CAD 04-09-19   Postpartum care following cesarean delivery (10/24) 01/10/2018   R C/S 10/24 01/10/2018   Pill esophagitis due to doxycycline  12/29/2016   Current Meds  Medication Sig   Calcium -Vitamins C & D (CALCIUM /C/D PO) Take by mouth.   COLLAGEN PO Take by mouth.   guanFACINE  (INTUNIV ) 1 MG TB24 ER tablet Take 1 tablet (1 mg total) by mouth daily.   levonorgestrel (MIRENA, 52 MG,) 20 MCG/DAY IUD 1 each by Intrauterine route once.   MAGNESIUM PO    Multiple Vitamin (MULTIVITAMIN PO) Take by mouth.   nitrofurantoin , macrocrystal-monohydrate, (MACROBID ) 100 MG capsule Take 1 capsule (100 mg total) by mouth 2 (two) times daily.   Omega-3 Fatty Acids (FISH OIL PO) Take by mouth.   sertraline  (ZOLOFT ) 50  MG tablet Take 1 tablet (50 mg total) by mouth daily.    Allergies: Patient has no known allergies. Family History: Patient family history includes Anxiety disorder in her daughter, maternal grandmother, and mother; Bipolar disorder in her mother; Cancer in her father and maternal grandmother; Coronary artery disease in her maternal grandmother; Depression in her mother; Early death in her mother; Heart attack in her mother; Heart disease in her maternal grandmother; Heart disease (age of onset: 15) in her mother;  Hyperlipidemia in her father; Hypertension in her father; Lymphoma in her father; Paranoid behavior in her mother. Social History:  Patient  reports that she quit smoking about 13 years ago. Her smoking use included cigarettes. She started smoking about 22 years ago. She has a 2.3 pack-year smoking history. She has never used smokeless tobacco. She reports current alcohol use of about 1.0 standard drink of alcohol per week. She reports that she does not use drugs.  Review of Systems: Constitutional: Negative for fever malaise or anorexia Cardiovascular: negative for chest pain Respiratory: negative for SOB or persistent cough Gastrointestinal: negative for abdominal pain  OBJECTIVE Vitals: Ht 5' 4 (1.626 m)   Wt 137 lb (62.1 kg)   BMI 23.52 kg/m  General: no acute distress , A&Ox3  Lavern LITTIE Heck, MD

## 2024-01-04 LAB — HM MAMMOGRAPHY

## 2024-03-03 ENCOUNTER — Other Ambulatory Visit: Payer: Self-pay | Admitting: Physician Assistant

## 2024-03-03 DIAGNOSIS — F411 Generalized anxiety disorder: Secondary | ICD-10-CM

## 2024-03-10 ENCOUNTER — Encounter: Payer: Self-pay | Admitting: Physician Assistant

## 2024-03-10 ENCOUNTER — Ambulatory Visit: Admitting: Physician Assistant

## 2024-03-10 DIAGNOSIS — F411 Generalized anxiety disorder: Secondary | ICD-10-CM | POA: Diagnosis not present

## 2024-03-10 DIAGNOSIS — F902 Attention-deficit hyperactivity disorder, combined type: Secondary | ICD-10-CM | POA: Diagnosis not present

## 2024-03-10 MED ORDER — SERTRALINE HCL 50 MG PO TABS: 75.0000 mg | ORAL_TABLET | Freq: Every day | ORAL | 1 refills | Status: AC

## 2024-03-10 MED ORDER — GUANFACINE HCL ER 1 MG PO TB24: 1.0000 mg | ORAL_TABLET | Freq: Every day | ORAL | 1 refills | Status: AC

## 2024-03-10 NOTE — Progress Notes (Signed)
 "     [facility]  Patient ID: Stephanie Harrington,  MRN: [id number]  PCP: Wendolyn Jenkins Jansky, MD  Date of Evaluation: 03/10/2024 Time spent:20 minutes  Chief Complaint:  Chief Complaint   ADHD; Anxiety; Follow-up    HISTORY/CURRENT STATUS: HPI  For routine med check.    Has been a little more anxious. Just generalized, overwhelmed. No PA. She is able to enjoy things.  Energy and motivation are good.  Work is going well. 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   No extreme sadness, tearfulness, or feelings of hopelessness.  Sleeps well most of the time. ADLs and personal hygiene are normal.   Denies any changes in concentration, making decisions, or remembering things.  Appetite has not changed.  Weight is stable.   No mania, delirium, AH/VH.  No SI/HI.  Individual Medical History/ Review of Systems: Changes? :No   Past medications for mental health diagnoses include: Zoloft   Allergies: Patient has no known allergies.  Current Medications:  Current Outpatient Medications:    Calcium -Vitamins C & D (CALCIUM /C/D PO), Take by mouth., Disp: , Rfl:    COLLAGEN PO, Take by mouth., Disp: , Rfl:    guanFACINE  (INTUNIV ) 1 MG TB24 ER tablet, Take 1 tablet (1 mg total) by mouth daily., Disp: 90 tablet, Rfl: 1   levonorgestrel (MIRENA, 52 MG,) 20 MCG/DAY IUD, 1 each by Intrauterine route once., Disp: , Rfl:    MAGNESIUM PO, , Disp: , Rfl:    Multiple Vitamin (MULTIVITAMIN PO), Take by mouth., Disp: , Rfl:    nitrofurantoin , macrocrystal-monohydrate, (MACROBID ) 100 MG capsule, Take 1 capsule (100 mg total) by mouth 2 (two) times daily., Disp: 10 capsule, Rfl: 0   Omega-3 Fatty Acids (FISH OIL PO), Take by mouth., Disp: , Rfl:    sertraline  (ZOLOFT ) 50 MG tablet, Take 1.5 tablets (75 mg total) by mouth daily., Disp: 135 tablet, Rfl: 1 Medication Side Effects: none  Family Medical/ Social History: Changes? No  MENTAL HEALTH EXAM:  There were no vitals taken for this visit.There is no  height or weight on file to calculate BMI.  General Appearance: Casual and Well Groomed  Eye Contact:  Good  Speech:  Clear and Coherent and Normal Rate  Volume:  Normal  Mood:  Euthymic  Affect:  Congruent  Thought Process:  Goal Directed and Descriptions of Associations: Circumstantial  Orientation:  Full (Time, Place, and Person)  Thought Content: Logical   Suicidal Thoughts:  No  Homicidal Thoughts:  No  Memory:  WNL  Judgement:  Good  Insight:  Good  Psychomotor Activity:  Normal  Concentration:  Concentration: Good and Attention Span: Good  Recall:  Good  Fund of Knowledge: Good  Language: Good  Assets:  Communication Skills Desire for Improvement Financial Resources/Insurance Housing Resilience Social Support Transportation Vocational/Educational  ADL's:  Intact  Cognition: WNL  Prognosis:  Good   DIAGNOSES:    ICD-10-CM   1. Generalized anxiety disorder  F41.1 sertraline  (ZOLOFT ) 50 MG tablet    2. Attention deficit hyperactivity disorder (ADHD), combined type  F90.2      Receiving Psychotherapy: No   RECOMMENDATIONS:   PDMP reviewed.  No controlled substances. I provided approximately  20 minutes of face to face time during this encounter, including time spent before and after the visit in records review, medical decision making, counseling pertinent to today's visit, and charting.   Discussed the anxiety.  Recommend increasing Zoloft . We also briefly discussed Trintellix, we  could change to that if she has SE from increased dose of Zoloft , such as sexual dysfunction. She can call and we'll switch over the phone if appropriate.   Continue Intuniv  1 mg, 1 p.o. nightly. Increase Zoloft  50 mg,  to 1.5 pills daily.  Return in 3 months.   Verneita Cooks, PA-C  "

## 2024-06-09 ENCOUNTER — Ambulatory Visit: Admitting: Physician Assistant

## 2024-10-16 ENCOUNTER — Encounter: Admitting: Family Medicine
